# Patient Record
Sex: Female | Born: 1940 | Race: White | Hispanic: No | Smoking: Never smoker
Health system: Southern US, Community
[De-identification: ages and names within clinical notes are randomized; demographics above are authoritative.]

## PROBLEM LIST (undated history)

## (undated) DIAGNOSIS — E119 Type 2 diabetes mellitus without complications: Secondary | ICD-10-CM

## (undated) HISTORY — PX: APPENDECTOMY: SHX54

## (undated) HISTORY — PX: BREAST BIOPSY: SHX20

## (undated) HISTORY — PX: ABDOMINAL HYSTERECTOMY: SHX81

## (undated) HISTORY — PX: CARDIAC CATHETERIZATION: SHX172

## (undated) HISTORY — PX: CHOLECYSTECTOMY: SHX55

---

## 2003-11-04 ENCOUNTER — Other Ambulatory Visit: Payer: Self-pay

## 2003-11-05 ENCOUNTER — Other Ambulatory Visit: Payer: Self-pay

## 2004-06-18 ENCOUNTER — Encounter: Payer: Self-pay | Admitting: Cardiology

## 2004-07-19 ENCOUNTER — Encounter: Payer: Self-pay | Admitting: Cardiology

## 2004-08-18 ENCOUNTER — Encounter: Payer: Self-pay | Admitting: Cardiology

## 2004-08-23 ENCOUNTER — Emergency Department: Payer: Self-pay | Admitting: Emergency Medicine

## 2004-09-18 ENCOUNTER — Encounter: Payer: Self-pay | Admitting: Cardiology

## 2004-10-19 ENCOUNTER — Encounter: Payer: Self-pay | Admitting: Cardiology

## 2004-11-16 ENCOUNTER — Encounter: Payer: Self-pay | Admitting: Cardiology

## 2004-12-17 ENCOUNTER — Encounter: Payer: Self-pay | Admitting: Cardiology

## 2005-01-16 ENCOUNTER — Ambulatory Visit: Payer: Self-pay | Admitting: Urology

## 2005-01-16 ENCOUNTER — Encounter: Payer: Self-pay | Admitting: Cardiology

## 2005-01-25 ENCOUNTER — Other Ambulatory Visit: Payer: Self-pay

## 2005-01-26 ENCOUNTER — Ambulatory Visit: Payer: Self-pay | Admitting: Urology

## 2005-01-29 ENCOUNTER — Emergency Department: Payer: Self-pay | Admitting: Emergency Medicine

## 2005-02-08 ENCOUNTER — Ambulatory Visit: Payer: Self-pay | Admitting: Unknown Physician Specialty

## 2005-02-16 ENCOUNTER — Encounter: Payer: Self-pay | Admitting: Cardiology

## 2005-03-18 ENCOUNTER — Encounter: Payer: Self-pay | Admitting: Cardiology

## 2005-04-26 ENCOUNTER — Ambulatory Visit: Payer: Self-pay | Admitting: Unknown Physician Specialty

## 2006-12-21 ENCOUNTER — Ambulatory Visit: Payer: Self-pay | Admitting: Unknown Physician Specialty

## 2006-12-21 ENCOUNTER — Other Ambulatory Visit: Payer: Self-pay

## 2006-12-28 ENCOUNTER — Ambulatory Visit: Payer: Self-pay | Admitting: Unknown Physician Specialty

## 2007-09-05 ENCOUNTER — Ambulatory Visit: Payer: Self-pay | Admitting: Unknown Physician Specialty

## 2009-04-22 ENCOUNTER — Ambulatory Visit: Payer: Self-pay | Admitting: Unknown Physician Specialty

## 2009-07-02 ENCOUNTER — Emergency Department: Payer: Self-pay | Admitting: Emergency Medicine

## 2010-06-06 ENCOUNTER — Ambulatory Visit: Payer: Self-pay | Admitting: Unknown Physician Specialty

## 2010-08-31 ENCOUNTER — Ambulatory Visit: Payer: Self-pay | Admitting: Unknown Physician Specialty

## 2011-02-21 ENCOUNTER — Emergency Department: Payer: Self-pay | Admitting: Emergency Medicine

## 2011-03-08 ENCOUNTER — Emergency Department: Payer: Self-pay | Admitting: Emergency Medicine

## 2011-04-14 ENCOUNTER — Ambulatory Visit: Payer: Self-pay | Admitting: Unknown Physician Specialty

## 2011-07-05 ENCOUNTER — Ambulatory Visit: Payer: Self-pay | Admitting: Unknown Physician Specialty

## 2012-03-07 ENCOUNTER — Emergency Department: Payer: Self-pay | Admitting: Emergency Medicine

## 2012-03-08 LAB — COMPREHENSIVE METABOLIC PANEL
Albumin: 3.3 g/dL — ABNORMAL LOW (ref 3.4–5.0)
Alkaline Phosphatase: 218 U/L — ABNORMAL HIGH (ref 50–136)
Calcium, Total: 8.9 mg/dL (ref 8.5–10.1)
Chloride: 103 mmol/L (ref 98–107)
EGFR (African American): >60
Potassium: 4.7 mmol/L (ref 3.5–5.1)
SGPT (ALT): 39 U/L
Sodium: 138 mmol/L (ref 136–145)

## 2012-03-08 LAB — URINALYSIS, COMPLETE
Blood: NEGATIVE
Glucose,UR: NEGATIVE mg/dL (ref 0–75)
Ketone: NEGATIVE
Nitrite: NEGATIVE
Ph: 6 (ref 4.5–8.0)
Protein: NEGATIVE
RBC,UR: <1 /HPF (ref 0–5)
Specific Gravity: 1.008 (ref 1.003–1.030)
Squamous Epithelial: 1
WBC UR: <1 /HPF (ref 0–5)

## 2012-03-08 LAB — CBC
MCH: 32.4 pg (ref 26.0–34.0)
MCHC: 33.8 g/dL (ref 32.0–36.0)
Platelet: 162 10*3/uL (ref 150–440)
RDW: 15.2 % — ABNORMAL HIGH (ref 11.5–14.5)

## 2013-05-13 ENCOUNTER — Ambulatory Visit: Payer: Self-pay | Admitting: Physician Assistant

## 2014-03-07 LAB — CBC
HCT: 34.2 % — AB (ref 35.0–47.0)
HGB: 11.3 g/dL — ABNORMAL LOW (ref 12.0–16.0)
MCH: 31.9 pg (ref 26.0–34.0)
MCHC: 32.9 g/dL (ref 32.0–36.0)
MCV: 97 fL (ref 80–100)
Platelet: 181 10*3/uL (ref 150–440)
RBC: 3.52 10*6/uL — AB (ref 3.80–5.20)
RDW: 14.1 % (ref 11.5–14.5)
WBC: 9.9 10*3/uL (ref 3.6–11.0)

## 2014-03-07 LAB — COMPREHENSIVE METABOLIC PANEL
ALK PHOS: 152 U/L — AB
Albumin: 3.4 g/dL (ref 3.4–5.0)
Anion Gap: 6 — ABNORMAL LOW (ref 7–16)
BUN: 7 mg/dL (ref 7–18)
Bilirubin,Total: 0.2 mg/dL (ref 0.2–1.0)
CO2: 26 mmol/L (ref 21–32)
Calcium, Total: 9.1 mg/dL (ref 8.5–10.1)
Chloride: 103 mmol/L (ref 98–107)
Creatinine: 0.7 mg/dL (ref 0.60–1.30)
EGFR (African American): >60
EGFR (Non-African Amer.): >60
Glucose: 129 mg/dL — ABNORMAL HIGH (ref 65–99)
Osmolality: 270 (ref 275–301)
POTASSIUM: 3.8 mmol/L (ref 3.5–5.1)
SGOT(AST): 21 U/L (ref 15–37)
SGPT (ALT): 21 U/L (ref 12–78)
Sodium: 135 mmol/L — ABNORMAL LOW (ref 136–145)
TOTAL PROTEIN: 7.6 g/dL (ref 6.4–8.2)

## 2014-03-07 LAB — TROPONIN I: Troponin-I: <0.02 ng/mL

## 2014-03-08 ENCOUNTER — Observation Stay: Payer: Self-pay | Admitting: Internal Medicine

## 2014-03-08 LAB — TROPONIN I
Troponin-I: <0.02 ng/mL
Troponin-I: <0.02 ng/mL

## 2014-03-08 LAB — CK TOTAL AND CKMB (NOT AT ARMC)
CK, Total: 21 U/L — ABNORMAL LOW
CK, Total: 23 U/L — ABNORMAL LOW

## 2014-03-08 LAB — CK-MB: CK-MB: <0.5 ng/mL — ABNORMAL LOW (ref 0.5–3.6)

## 2015-01-09 NOTE — H&P (Signed)
PATIENT NAME:  Connie Ortega, Connie Ortega MR#:  [id number] DATE OF BIRTH:  [birth year]  DATE OF ADMISSION:  03/08/2014  REFERRING PHYSICIAN:  Dr. Jacqualine Code.   PRIMARY CARE PHYSICIAN:  Dr. Carrie Mew.   CARDIOLOGIST:  Dr. Ubaldo Glassing.   CHIEF COMPLAINT:  Chest pain.   HISTORY OF PRESENT ILLNESS:  A 74 year old Caucasian female with past medical history of coronary artery disease status post PCI and stenting x 2 presenting with chest pain.  She describes acute onset of chest pain which occurred at rest located over the left chest with radiation to the left neck and back.  Describes this as dull in quality, 9 to 10 out of 10 in intensity, actually pleuritic in nature.  Denies any relieving factors or any associated symptoms such as shortness of breath, diaphoresis or nausea.  Currently, she is pain-free and no symptoms at this time.   REVIEW OF SYSTEMS:  CONSTITUTIONAL:  Denies fever, chills, fatigue, weakness.  EYES:  Denies blurry vision, double vision, eye pain.  EARS, NOSE, THROAT:  Denies tinnitus, ear pain, hearing loss.  RESPIRATORY:  Denies cough, wheeze, shortness of breath.  CARDIOVASCULAR:  Positive for chest pain as described above; however, denies any palpitations, edema.  GASTROINTESTINAL:  Denies nausea, vomiting, diarrhea, abdominal pain.  GENITOURINARY:  Denies dysuria, hematuria.  ENDOCRINE:  Denies nocturia or thyroid problems.  HEMATOLOGIC AND LYMPHATIC:  Denies easy bruising, bleeding.  SKIN:  Denies rashes or lesions.  MUSCULOSKELETAL:  Denies pain in neck, back, shoulder, knees, hips or arthritic symptoms. NEUROLOGIC:  Denies paralysis, paresthesias.  PSYCHIATRIC:  Denies anxiety or depressive symptoms.  Otherwise, full review of systems performed by me is negative.   PAST MEDICAL HISTORY:  Coronary artery disease status post PCI and stent placement x 2, diabetes non-insulin-requiring, seizure disorder.   SOCIAL HISTORY:  Denies any alcohol, tobacco or drug usage.   FAMILY HISTORY:   Positive for coronary artery disease.   ALLERGIES:  AUGMENTIN, CIPRO, CODEINE, DEMEROL, DOXYCYCYLINE, ETODOLAC, MACROBID, TALWIN, ULTRAM, VIBRAMYCIN AND LATEX.   HOME MEDICATIONS:  Include aspirin 81 mg by mouth daily, tramadol 50 mg by mouth q. 4 hours as needed for pain, Tylenol 325 mg 2 tabs 3 times daily as needed for pain or fever, Micardis 80 mg by mouth daily, Dilantin 100 mg 3 capsules by mouth at bedtime, Dilantin 50 mg chewable every other night, gabapentin 300 mg by mouth twice daily, fluoxetine 30 mg by mouth daily, trazodone 50 mg by mouth at bedtime, glipizide 5 mg half tablet by mouth twice daily, metformin 1000 mg in the morning, 1500 mg in the evening, simvastatin 40 mg by mouth at bedtime, Plavix 75 mg by mouth daily, Toprol-XL 100 mg by mouth daily, nystatin triamcinolone topical ointment apply to affected areas twice daily, Prilosec 20 mg by mouth twice daily.   PHYSICAL EXAMINATION: VITAL SIGNS:  Temperature 97.6, heart rate 85, respirations 24, blood pressure 205/84 on arrival, currently 135/58, saturating 94% on room air.  Weight 119.7 kg, BMI of 46.8.  GENERAL:  Well-nourished, well-developed, Caucasian female, currently in no acute distress.  HEAD:  Normocephalic, atraumatic.  EYES:  Pupils equal, round, reactive to light.  Extraocular muscles intact.  No scleral icterus.  MOUTH:  Moist mucosal membranes.  Dentition intact.  No abscess noted.  EARS, NOSE, THROAT:  Clear without exudates.  No external lesions.  NECK:  Supple.  No thyromegaly.  No nodules.  No JVD.  PULMONARY:  Clear to auscultation bilaterally without wheezes, rubs or rhonchi.  No accessory  muscle use.  Good respiratory effort.  CHEST:  Nontender to palpation.  CARDIOVASCULAR:  S1, S2, regular rate and rhythm.  No murmurs, rubs, or gallops.  No edema.  Pedal pulses 2+ bilaterally. GASTROINTESTINAL:  Soft, nontender, nondistended.  No masses.  Positive bowel sounds.  No hepatosplenomegaly.  MUSCULOSKELETAL:   No swelling, clubbing, or edema.  Range of motion full in all extremities.  NEUROLOGIC:  Cranial nerves II through XII intact.  No gross focal neurological deficits.  Sensation intact.  Reflexes intact.  SKIN:  No ulceration, lesions, rash, cyanosis.  Skin warm, dry.  Turgor intact.  PSYCHIATRIC:  Mood and affect within normal limits.  The patient is awake, alert, oriented x 3.  Insight and judgment intact.   LABORATORY DATA:  EKG performed.  Normal sinus rhythm without ST or T wave abnormalities.  Chest x-ray performed revealing no acute cardiopulmonary process.  CT chest to rule out PE performed, no evidence of PE.  There is evidence of diffuse thickening of the esophagus at the GE junction.  Remainder of laboratory data:  Sodium 135, potassium 3.8, chloride 103, bicarb 26, BUN 7, creatinine 1.7, glucose 129.  LFTs:  Alk phos of 152, otherwise within normal limits.  Troponin I less than 0.02.  WBC 9.9, hemoglobin 11.3, platelets of 181.   ASSESSMENT AND PLAN:  A 74 year old Caucasian female with history of coronary artery disease status post PCI and stent placement x 2 presenting with chest pain.  1.  Chest pain, initiate aspirin and statin therapy.  Place on telemetry under observational status.  Trend cardiac enzymes x 3.  CT was negative for acute pulmonary embolus.  We will also consult her cardiologist as she follows with Dr. Ubaldo Glassing on a routine basis.  2.  Diabetes type 2.  Hold by mouth agents.  Add insulin sliding scale with q. 6 hour Accu-Cheks while she is in the hospital.  3.  Seizure disorder.  Continue with her Dilantin.  4.  Elevated alkaline phosphatase.  Asymptomatic at this time.  It also appears to be chronic.  She should receive an outpatient work-up of this.  5.  Thickening of the esophagus at the gastroesophageal junction.  Continue with proton pump inhibitor therapy.  Will need outpatient follow-up for this as well.  6.  Venous thromboembolism prophylaxis with heparin subQ.  7.   CODE STATUS:  THE PATIENT IS FULL CODE.   TIME SPENT:  45 minutes.     ____________________________ Aaron Mose. Michiel Sivley, MD dkh:ea D: 03/08/2014 01:44:07 ET T: 03/08/2014 02:19:53 ET JOB#: [id number]  cc: Aaron Mose. Aava Deland, MD, <Dictator> Yaslene Lindamood Woodfin Ganja MD ELECTRONICALLY SIGNED 03/09/2014 1:37

## 2015-01-09 NOTE — Consult Note (Signed)
Brief Consult Note: Diagnosis: chest pain with atypcial features.   Patient was seen by consultant.   Comments: [id number] yowit history of cad s/p pci of lad who was admitted with atypcial cp. Ruled out for mi. No ischemia on ekg. OK for discharge from cardiac standpoint on current meds. Call office in am to schedule outpatinet stress test.  Electronic Signatures: Dalia HeadingFath, Kenneth A (MD)  (Signed 21-Jun-15 13:21)  Authored: Brief Consult Note   Last Updated: 21-Jun-15 13:21 by Dalia HeadingFath, Kenneth A (MD)

## 2015-01-09 NOTE — Discharge Summary (Signed)
PATIENT NAME:  Connie Ortega, [name] MR#:  [id number] DATE OF BIRTH:  1940/12/07  DATE OF ADMISSION:  03/08/2014 DATE OF DISCHARGE:  03/08/2014  PRIMARY CARE PHYSICIAN: Maurine MinisterMiriam McLaughlin, PA-C   DISCHARGE DIAGNOSES: 1.  Atypical chest pain.  2.  Coronary artery disease.  3.  Diabetes.  4.  Hypertension.  5.  Seizure disorder.   CONDITION: Stable.   CODE STATUS: Full code.   HOME MEDICATIONS: Please refer to the medication reconciliation list.   DIET: Low-sodium, low-fat, low-cholesterol, ADA diet.   ACTIVITY: As tolerated.   FOLLOWUP CARE: Follow up with PCP within 1 to 2 weeks. Follow up with Dr. Lady GaryFath within 1 to 2 weeks.   REASON FOR ADMISSION: Chest pain.   HOSPITAL COURSE: The patient is 74 year old Caucasian female with a history of coronary artery disease, diabetes, hypertension presented to the ED with chest pain which is on the left side, radiation to left leg and back. For detailed history and physical examination, please refer to the admission note dictated by Dr. Clint GuyHower. Laboratory data on admission date showed troponin was less than 0.02. EKG normal sinus rhythm. Chest x-ray no acute cardiopulmonary process. CT of the chest to rule out PE. WBC 9.9, hemoglobin 11.3.  1.  Chest pain which is atypical. The patient had complaints of lower part of the substernal and epigastric area pain which is exacerbated by breathing and palpation. The patient's troponin level has been negative. She has been treated with aspirin. Dr. Lady GaryFath evaluated the patient and suggested that the patient may be discharged to home today and follow up with him as outpatient.  2.  Diabetes. Has been treated with sliding scale.  3.  The patient said she has a history of peptic ulcer disease.  The patient may need to follow up with PCP or gastrointestinal physician as outpatient after discharge.   Discussed the patient's discharge plan with the patient and nurse.   TIME SPENT: About 35 minutes.    ____________________________ Shaune PollackQing Chen, MD qc:cs D: 03/08/2014 13:58:14 ET T: 03/08/2014 18:25:04 ET JOB#: [id number]  cc: Shaune PollackQing Chen, MD, <Dictator> Shaune PollackQING CHEN MD ELECTRONICALLY SIGNED 03/09/2014 18:05

## 2015-05-13 ENCOUNTER — Other Ambulatory Visit: Payer: Self-pay | Admitting: Physician Assistant

## 2015-05-13 DIAGNOSIS — Z1231 Encounter for screening mammogram for malignant neoplasm of breast: Secondary | ICD-10-CM

## 2015-05-19 ENCOUNTER — Other Ambulatory Visit: Payer: Self-pay | Admitting: Physician Assistant

## 2015-05-19 ENCOUNTER — Ambulatory Visit
Admission: RE | Admit: 2015-05-19 | Discharge: 2015-05-19 | Disposition: A | Discharge status: Home / Self Care | Payer: Medicare Other | Source: Ambulatory Visit | Attending: Physician Assistant | Admitting: Physician Assistant

## 2015-05-19 DIAGNOSIS — Z1231 Encounter for screening mammogram for malignant neoplasm of breast: Secondary | ICD-10-CM | POA: Insufficient documentation

## 2015-06-18 ENCOUNTER — Encounter: Payer: Self-pay | Admitting: Emergency Medicine

## 2015-06-18 ENCOUNTER — Emergency Department: Payer: Medicare Other

## 2015-06-18 ENCOUNTER — Emergency Department
Admission: EM | Admit: 2015-06-18 | Discharge: 2015-06-18 | Disposition: A | Discharge status: Home / Self Care | Payer: Medicare Other | Attending: Emergency Medicine | Admitting: Emergency Medicine

## 2015-06-18 DIAGNOSIS — S6992XA Unspecified injury of left wrist, hand and finger(s), initial encounter: Secondary | ICD-10-CM | POA: Diagnosis present

## 2015-06-18 DIAGNOSIS — Z7902 Long term (current) use of antithrombotics/antiplatelets: Secondary | ICD-10-CM | POA: Insufficient documentation

## 2015-06-18 DIAGNOSIS — S8002XA Contusion of left knee, initial encounter: Secondary | ICD-10-CM | POA: Insufficient documentation

## 2015-06-18 DIAGNOSIS — Z7982 Long term (current) use of aspirin: Secondary | ICD-10-CM | POA: Diagnosis not present

## 2015-06-18 DIAGNOSIS — Y9389 Activity, other specified: Secondary | ICD-10-CM | POA: Insufficient documentation

## 2015-06-18 DIAGNOSIS — Y92481 Parking lot as the place of occurrence of the external cause: Secondary | ICD-10-CM | POA: Diagnosis not present

## 2015-06-18 DIAGNOSIS — W01198A Fall on same level from slipping, tripping and stumbling with subsequent striking against other object, initial encounter: Secondary | ICD-10-CM | POA: Diagnosis not present

## 2015-06-18 DIAGNOSIS — Z79899 Other long term (current) drug therapy: Secondary | ICD-10-CM | POA: Diagnosis not present

## 2015-06-18 DIAGNOSIS — Y998 Other external cause status: Secondary | ICD-10-CM | POA: Insufficient documentation

## 2015-06-18 DIAGNOSIS — S61412A Laceration without foreign body of left hand, initial encounter: Secondary | ICD-10-CM | POA: Diagnosis not present

## 2015-06-18 DIAGNOSIS — S52515A Nondisplaced fracture of left radial styloid process, initial encounter for closed fracture: Secondary | ICD-10-CM | POA: Insufficient documentation

## 2015-06-18 DIAGNOSIS — E119 Type 2 diabetes mellitus without complications: Secondary | ICD-10-CM | POA: Diagnosis not present

## 2015-06-18 DIAGNOSIS — S60212A Contusion of left wrist, initial encounter: Secondary | ICD-10-CM | POA: Diagnosis not present

## 2015-06-18 DIAGNOSIS — S0512XA Contusion of eyeball and orbital tissues, left eye, initial encounter: Secondary | ICD-10-CM | POA: Insufficient documentation

## 2015-06-18 DIAGNOSIS — S0083XA Contusion of other part of head, initial encounter: Secondary | ICD-10-CM

## 2015-06-18 DIAGNOSIS — S0990XA Unspecified injury of head, initial encounter: Secondary | ICD-10-CM | POA: Insufficient documentation

## 2015-06-18 DIAGNOSIS — W19XXXA Unspecified fall, initial encounter: Secondary | ICD-10-CM

## 2015-06-18 DIAGNOSIS — S52502A Unspecified fracture of the lower end of left radius, initial encounter for closed fracture: Secondary | ICD-10-CM

## 2015-06-18 HISTORY — DX: Type 2 diabetes mellitus without complications: E11.9

## 2015-06-18 MED ORDER — LIDOCAINE HCL (PF) 1 % IJ SOLN
INTRAMUSCULAR | Status: AC
  Filled 2015-06-18: qty 5

## 2015-06-18 NOTE — ED Notes (Addendum)
Pt arrived via EMS from apartment complex.  Patient states she was just paying her rent and tripped over the curb outside her building and fell on left side of face.  Large hematoma over left eye on arrival and skin tear to left head and elbow.  Pt denies LOC.  No vision problems.  Uses cane usually at home.  Had a fall previously in her apartment earlier this year.

## 2015-06-18 NOTE — Discharge Instructions (Signed)
Facial or Scalp Contusion A facial or scalp contusion is a deep bruise on the face or head. Injuries to the face and head generally cause a lot of swelling, especially around the eyes. Contusions are the result of an injury that caused bleeding under the skin. The contusion may turn blue, purple, or yellow. Minor injuries will give you a painless contusion, but more severe contusions may stay painful and swollen for a few weeks.  CAUSES  A facial or scalp contusion is caused by a blunt injury or trauma to the face or head area.  SIGNS AND SYMPTOMS   Swelling of the injured area.   Discoloration of the injured area.   Tenderness, soreness, or pain in the injured area.  DIAGNOSIS  The diagnosis can be made by taking a medical history and doing a physical exam. An X-ray exam, CT scan, or MRI may be needed to determine if there are any associated injuries, such as broken bones (fractures). TREATMENT  Often, the best treatment for a facial or scalp contusion is applying cold compresses to the injured area. Over-the-counter medicines may also be recommended for pain control.  HOME CARE INSTRUCTIONS   Only take over-the-counter or prescription medicines as directed by your health care provider.   Apply ice to the injured area.   Put ice in a plastic bag.   Place a towel between your skin and the bag.   Leave the ice on for 20 minutes, 2-3 times a day.  SEEK MEDICAL CARE IF:  You have bite problems.   You have pain with chewing.   You are concerned about facial defects. SEEK IMMEDIATE MEDICAL CARE IF:  You have severe pain or a headache that is not relieved by medicine.   You have unusual sleepiness, confusion, or personality changes.   You throw up (vomit).   You have a persistent nosebleed.   You have double vision or blurred vision.   You have fluid drainage from your nose or ear.   You have difficulty walking or using your arms or legs.  MAKE SURE YOU:    Understand these instructions.  Will watch your condition.  Will get help right away if you are not doing well or get worse. Document Released: 10/12/2004 Document Revised: 06/25/2013 Document Reviewed: 04/17/2013 [facility] Patient Information 2015 [place], [place]. This information is not intended to replace advice given to you by your health care provider. Make sure you discuss any questions you have with your health care provider.  Forearm Fracture Your caregiver has diagnosed you as having a broken bone (fracture) of the forearm. This is the part of your arm between the elbow and your wrist. Your forearm is made up of two bones. These are the radius and ulna. A fracture is a break in one or both bones. A cast or splint is used to protect and keep your injured bone from moving. The cast or splint will be on generally for about 5 to 6 weeks, with individual variations. HOME CARE INSTRUCTIONS   Keep the injured part elevated while sitting or lying down. Keeping the injury above the level of your heart (the center of the chest). This will decrease swelling and pain.  Apply ice to the injury for 15-20 minutes, 03-04 times per day while awake, for 2 days. Put the ice in a plastic bag and place a thin towel between the bag of ice and your cast or splint.  If you have a plaster or fiberglass cast:  Do  not try to scratch the skin under the cast using sharp or pointed objects.  Check the skin around the cast every day. You may put lotion on any red or sore areas.  Keep your cast dry and clean.  If you have a plaster splint:  Wear the splint as directed.  You may loosen the elastic around the splint if your fingers become numb, tingle, or turn cold or blue.  Do not put pressure on any part of your cast or splint. It may break. Rest your cast only on a pillow the first 24 hours until it is fully hardened.  Your cast or splint can be protected during bathing with a plastic bag. Do not lower  the cast or splint into water.  Only take over-the-counter or prescription medicines for pain, discomfort, or fever as directed by your caregiver. SEEK IMMEDIATE MEDICAL CARE IF:   Your cast gets damaged or breaks.  You have more severe pain or swelling than you did before the cast.  Your skin or nails below the injury turn blue or gray, or feel cold or numb.  There is a bad smell or new stains and/or pus like (purulent) drainage coming from under the cast. MAKE SURE YOU:   Understand these instructions.  Will watch your condition.  Will get help right away if you are not doing well or get worse. Document Released: 09/01/2000 Document Revised: 11/27/2011 Document Reviewed: 04/23/2008 [facility] Patient Information 2015 [place], [place]. This information is not intended to replace advice given to you by your health care provider. Make sure you discuss any questions you have with your health care provider.  Head Injury You have received a head injury. It does not appear serious at this time. Headaches and vomiting are common following head injury. It should be easy to awaken from sleeping. Sometimes it is necessary for you to stay in the emergency department for a while for observation. Sometimes admission to the hospital may be needed. After injuries such as yours, most problems occur within the first 24 hours, but side effects may occur up to 7-10 days after the injury. It is important for you to carefully monitor your condition and contact your health care provider or seek immediate medical care if there is a change in your condition. WHAT ARE THE TYPES OF HEAD INJURIES? Head injuries can be as minor as a bump. Some head injuries can be more severe. More severe head injuries include:  A jarring injury to the brain (concussion).  A bruise of the brain (contusion). This mean there is bleeding in the brain that can cause swelling.  A cracked skull (skull fracture).  Bleeding in the brain  that collects, clots, and forms a bump (hematoma). WHAT CAUSES A HEAD INJURY? A serious head injury is most likely to happen to someone who is in a car wreck and is not wearing a seat belt. Other causes of major head injuries include bicycle or motorcycle accidents, sports injuries, and falls. HOW ARE HEAD INJURIES DIAGNOSED? A complete history of the event leading to the injury and your current symptoms will be helpful in diagnosing head injuries. Many times, pictures of the brain, such as CT or MRI are needed to see the extent of the injury. Often, an overnight hospital stay is necessary for observation.  WHEN SHOULD I SEEK IMMEDIATE MEDICAL CARE?  You should get help right away if:  You have confusion or drowsiness.  You feel sick to your stomach (nauseous) or have continued,  forceful vomiting.  You have dizziness or unsteadiness that is getting worse.  You have severe, continued headaches not relieved by medicine. Only take over-the-counter or prescription medicines for pain, fever, or discomfort as directed by your health care provider.  You do not have normal function of the arms or legs or are unable to walk.  You notice changes in the black spots in the center of the colored part of your eye (pupil).  You have a clear or bloody fluid coming from your nose or ears.  You have a loss of vision. During the next 24 hours after the injury, you must stay with someone who can watch you for the warning signs. This person should contact local emergency services (911 in the [place] if you have seizures, you become unconscious, or you are unable to wake up. HOW CAN I PREVENT A HEAD INJURY IN THE FUTURE? The most important factor for preventing major head injuries is avoiding motor vehicle accidents. To minimize the potential for damage to your head, it is crucial to wear seat belts while riding in motor vehicles. Wearing helmets while bike riding and playing collision sports (like football) is  also helpful. Also, avoiding dangerous activities around the house will further help reduce your risk of head injury.  WHEN CAN I RETURN TO NORMAL ACTIVITIES AND ATHLETICS? You should be reevaluated by your health care provider before returning to these activities. If you have any of the following symptoms, you should not return to activities or contact sports until 1 week after the symptoms have stopped:  Persistent headache.  Dizziness or vertigo.  Poor attention and concentration.  Confusion.  Memory problems.  Nausea or vomiting.  Fatigue or tire easily.  Irritability.  Intolerant of bright lights or loud noises.  Anxiety or depression.  Disturbed sleep. MAKE SURE YOU:   Understand these instructions.  Will watch your condition.  Will get help right away if you are not doing well or get worse. Document Released: 09/04/2005 Document Revised: 09/09/2013 Document Reviewed: 05/12/2013 [facility] Patient Information 2015 [place], [place]. This information is not intended to replace advice given to you by your health care provider. Make sure you discuss any questions you have with your health care provider.  Laceration Care, Adult A laceration is a cut that goes through all layers of the skin. The cut goes into the tissue beneath the skin. HOME CARE For stitches (sutures) or staples:  Keep the cut clean and dry.  If you have a bandage (dressing), change it at least once a day. Change the bandage if it gets wet or dirty, or as told by your doctor.  Wash the cut with soap and water 2 times a day. Rinse the cut with water. Pat it dry with a clean towel.  Put a thin layer of medicated cream on the cut as told by your doctor.  You may shower after the first 24 hours. Do not soak the cut in water until the stitches are removed.  Only take medicines as told by your doctor.  Have your stitches or staples removed as told by your doctor. For skin adhesive strips:  Keep the cut  clean and dry.  Do not get the strips wet. You may take a bath, but be careful to keep the cut dry.  If the cut gets wet, pat it dry with a clean towel.  The strips will fall off on their own. Do not remove the strips that are still stuck to the cut.  For wound glue:  You may shower or take baths. Do not soak or scrub the cut. Do not swim. Avoid heavy sweating until the glue falls off on its own. After a shower or bath, pat the cut dry with a clean towel.  Do not put medicine on your cut until the glue falls off.  If you have a bandage, do not put tape over the glue.  Avoid lots of sunlight or tanning lamps until the glue falls off. Put sunscreen on the cut for the first year to reduce your scar.  The glue will fall off on its own. Do not pick at the glue. You may need a tetanus shot if:  You cannot remember when you had your last tetanus shot.  You have never had a tetanus shot. If you need a tetanus shot and you choose not to have one, you may get tetanus. Sickness from tetanus can be serious. GET HELP RIGHT AWAY IF:   Your pain does not get better with medicine.  Your arm, hand, leg, or foot loses feeling (numbness) or changes color.  Your cut is bleeding.  Your joint feels weak, or you cannot use your joint.  You have painful lumps on your body.  Your cut is red, puffy (swollen), or painful.  You have a red line on the skin near the cut.  You have yellowish-white fluid (pus) coming from the cut.  You have a fever.  You have a bad smell coming from the cut or bandage.  Your cut breaks open before or after stitches are removed.  You notice something coming out of the cut, such as wood or glass.  You cannot move a finger or toe. MAKE SURE YOU:   Understand these instructions.  Will watch your condition.  Will get help right away if you are not doing well or get worse. Document Released: 02/21/2008 Document Revised: 11/27/2011 Document Reviewed:  02/28/2011 [facility] Patient Information 2015 [place], [place]. This information is not intended to replace advice given to you by your health care provider. Make sure you discuss any questions you have with your health care provider.

## 2015-06-18 NOTE — ED Provider Notes (Signed)
[facility] Emergency Department Provider Note  ____________________________________________  Time seen: Approximately 4:31 PM  I have reviewed the triage vital signs and the nursing notes.   HISTORY  Chief Complaint Fall    HPI Connie Ortega is a 74 y.o. female presents via EMS for evaluation of falling prior to arrival. Patient states that she was outside and tripped over the curb in the parking lot landing directly on her face. Complains of swelling and bruising over her left eye, the skin tear to her left hand and elbow, bruising and swelling to her left knee.   Past Medical History  Diagnosis Date  . Diabetes mellitus without complication     There are no active problems to display for this patient.   Past Surgical History  Procedure Laterality Date  . Breast biopsy Right 20+ yrs ago    EXCISIONAL - NEG  . Breast biopsy Right 20+ yrs ago    EXCISIONAL - NEG  . Cardiac catheterization      2 stents in place  . Cholecystectomy    . Appendectomy    . Abdominal hysterectomy      Current Outpatient Rx  Name  Route  Sig  Dispense  Refill  . alendronate (FOSAMAX) 70 MG tablet   Oral   Take 70 mg by mouth once a week. Take with a full glass of water on an empty stomach.         Marland Kitchen aspirin 81 MG tablet   Oral   Take 81 mg by mouth daily.         . clopidogrel (PLAVIX) 75 MG tablet   Oral   Take 75 mg by mouth daily.         Marland Kitchen FLUoxetine (PROZAC) 10 MG capsule   Oral   Take 10 mg by mouth 3 (three) times daily.         . fluticasone (FLONASE) 50 MCG/ACT nasal spray   Each Nare   Place 2 sprays into both nostrils daily.         Marland Kitchen gabapentin (NEURONTIN) 300 MG capsule   Oral   Take 300 mg by mouth 3 (three) times daily.         Marland Kitchen glipiZIDE (GLUCOTROL) 5 MG tablet   Oral   Take 7.5 mg by mouth daily before breakfast.         . hydrochlorothiazide (HYDRODIURIL) 25 MG tablet   Oral   Take 25 mg by mouth daily.          . metFORMIN (GLUCOPHAGE) 1000 MG tablet   Oral   Take 1,000 mg by mouth 2 (two) times daily with a meal. Take one tablet in the morning and 1.5 tablet at supper         . pantoprazole (PROTONIX) 40 MG tablet   Oral   Take 40 mg by mouth daily.         . phenytoin (DILANTIN) 100 MG ER capsule   Oral   Take 100 mg by mouth 3 (three) times daily.         . phenytoin (DILANTIN) 50 MG tablet   Oral   Chew 50 mg by mouth every other day.         . simvastatin (ZOCOR) 40 MG tablet   Oral   Take 40 mg by mouth daily.         . traMADol (ULTRAM) 50 MG tablet   Oral   Take 50 mg by  mouth every 6 (six) hours as needed.         . traZODone (DESYREL) 50 MG tablet   Oral   Take 50 mg by mouth at bedtime.           Allergies Review of patient's allergies indicates no known allergies.  Family History  Problem Relation Age of Onset  . Breast cancer Neg Hx     Social History Social History  Substance Use Topics  . Smoking status: Never Smoker   . Smokeless tobacco: None  . Alcohol Use: No    Review of Systems Constitutional: No fever/chills Eyes: No visual changes. Positive hematoma inferior superior to the left eye. Positive periorbital edema and hematoma noted. ENT: No sore throat. Cardiovascular: Denies chest pain. Respiratory: Denies shortness of breath. Gastrointestinal: No abdominal pain.  No nausea, no vomiting.  No diarrhea.  No constipation. Genitourinary: Negative for dysuria. Musculoskeletal: Negative for back pain. The first with limited range of motion increased pain with pronation and supination positive ecchymosis noted in around the wrist and base of the thumb. 3 cm laceration noted to the palmar aspect of the left hand. Left knee with ecchymosis and bruising as well. Skin: Negative for rash. Neurological: Negative for headaches, focal weakness or numbness.  10-point ROS otherwise  negative.  ____________________________________________   PHYSICAL EXAM:  VITAL SIGNS: ED Triage Vitals  Enc Vitals Group     BP 06/18/15 1623 175/64 mmHg     Pulse Rate 06/18/15 1623 87     Resp 06/18/15 1623 18     Temp 06/18/15 1623 97.8 F (36.6 C)     Temp Source 06/18/15 1623 Oral     SpO2 06/18/15 1623 94 %     Weight 06/18/15 1623 265 lb (120.203 kg)     Height 06/18/15 1623 5' 4.25" (1.632 m)     Head Cir --      Peak Flow --      Pain Score 06/18/15 1624 7     Pain Loc --      Pain Edu? --      Excl. in GC? --     Constitutional: Alert and oriented. Well appearing and in no acute distress. Eyes: Conjunctivae are normal. PERRL. EOMI.  Positive hematoma inferior superior to the left eye. Positive periorbital Head: Traumatic Nose: No congestion/rhinnorhea. Mouth/Throat: Mucous membranes are moist.  Oropharynx non-erythematous. Neck: No stridor.  No spinal tenderness. Cardiovascular: Normal rate, regular rhythm. Grossly normal heart sounds.  Good peripheral circulation. Respiratory: Normal respiratory effort.  No retractions. Lungs CTAB. Musculoskeletal: No lower extremity tenderness nor edema.  No joint effusions. Negative for back pain. The first with limited range of motion increased pain with pronation and supination positive ecchymosis noted in around the wrist and base of the thumb. 3 cm laceration noted to the palmar aspect of the left hand. Left knee with ecchymosis and bruising as well. Neurologic:  Normal speech and language. No gross focal neurologic deficits are appreciated. No gait instability. Skin:  Skin is warm, dry areas of ecchymosis noted. Left hand is a 3 cm laceration to the palmar aspect. Psychiatric: Mood and affect are normal. Speech and behavior are normal.  ____________________________________________   LABS (all labs ordered are listed, but only abnormal results are displayed)  Labs Reviewed - No data to  display   RADIOLOGY  IMPRESSION: Nondisplaced intraarticular transverse fracture of the distal Radius. IMPRESSION: Nondisplaced fracture of the left radial styloid process. ____________________________________________   PROCEDURES  Procedure(s)  performed: None  Critical Care performed: Yes LACERATION REPAIR Performed by: Evangeline Dakin Authorized by: Evangeline Dakin Consent: Verbal consent obtained. Risks and benefits: risks, benefits and alternatives were discussed Consent given by: patient Patient identity confirmed: provided demographic data Prepped and Draped in normal sterile fashion Wound explored  Laceration Location: dorsum left hand  Laceration Length: 1.5 cm  No Foreign Bodies seen or palpated  Anesthesia: local infiltration  Local anesthetic: lidocaine 1% without epinephrine  Anesthetic total: 3 ml  Irrigation method: syringe Amount of cleaning: standard  Skin closure: 4-0 nylon  Number of sutures: 3  Technique: simple interrupted  Patient tolerance: Patient tolerated the procedure well with no immediate complications.   ____________________________________________   INITIAL IMPRESSION / ASSESSMENT AND PLAN / ED COURSE  Pertinent labs & imaging results that were available during my care of the patient were reviewed by me and considered in my medical decision making (see chart for details).  Status post fall with facial and head contusion and fracture distal radius laceration to dorsum of left hand superficial knee contusion and left ankle contusion. Laceration closed as noted above. Her splint applied. Patient to follow-up with Dr. Hyacinth Meeker, orthopedics on call for wrist evaluation and cast application. Sutures to be removed in one week. Patient currently has tramadol for pain at home she is to continue to take as needed. Follow up in the ER if needed. Head injury instructions given for family. Patient voices no other emergency medical  complaints at this time.  ____________________________________________   FINAL CLINICAL IMPRESSION(S) / ED DIAGNOSES  Final diagnoses:  Fall, initial encounter  Facial contusion, initial encounter  Distal radial fracture, left, closed, initial encounter  Knee contusion, left, initial encounter  Laceration of left hand, initial encounter      Evangeline Dakin, PA-C 06/18/15 1833  Myrna Blazer, MD 06/18/15 2154

## 2016-01-15 IMAGING — CT CT MAXILLOFACIAL W/O CM
2 series · 14 of 30 positions shown, 16 images · non-contrast
Comparison: Head CT July 02, 2009

CLINICAL DATA: Pain following fall

EXAM:
CT HEAD WITHOUT CONTRAST
CT MAXILLOFACIAL WITHOUT CONTRAST
TECHNIQUE: Multidetector CT imaging of the head and maxillofacial structures
were performed using the standard protocol without intravenous
contrast. Multiplanar CT image reconstructions of the maxillofacial
structures were also generated.

[Series 2: head wo · axial · 0.39mm/px · z∈[+258,+366]mm · 6 of 34 slices shown, 8 images]
[im 5/34  brain]
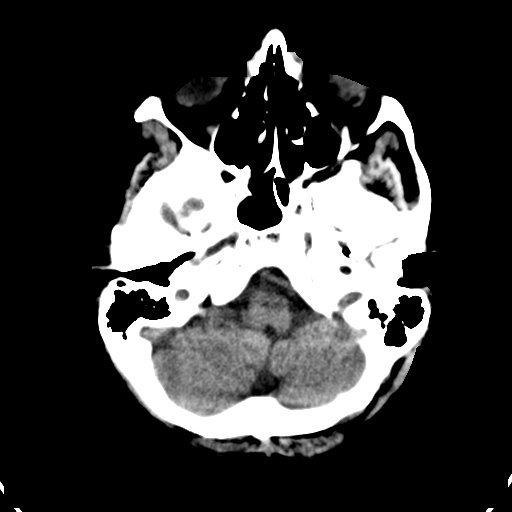
[im 5/34  bone]
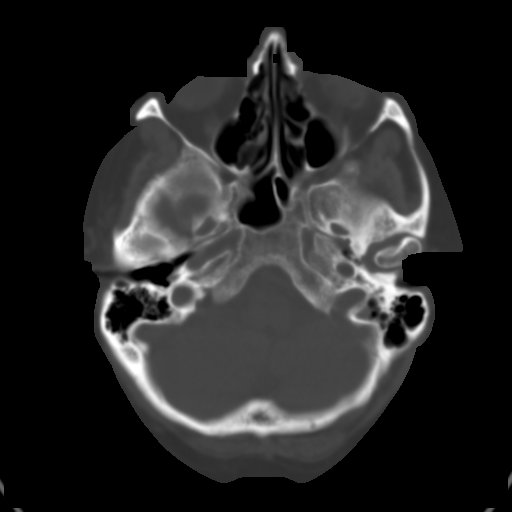
[im 10/34  bone]
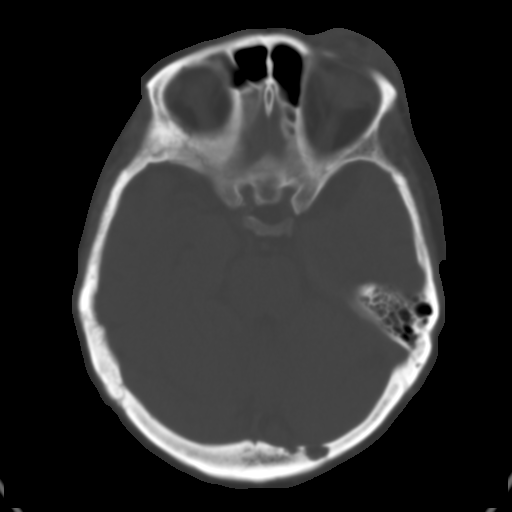
[im 15/34  bone]
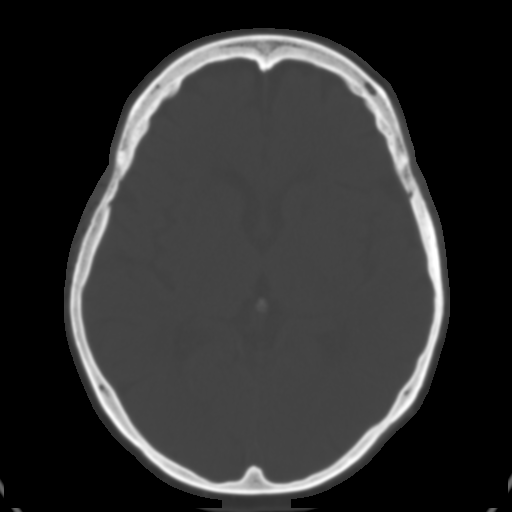
[im 19/34  bone]
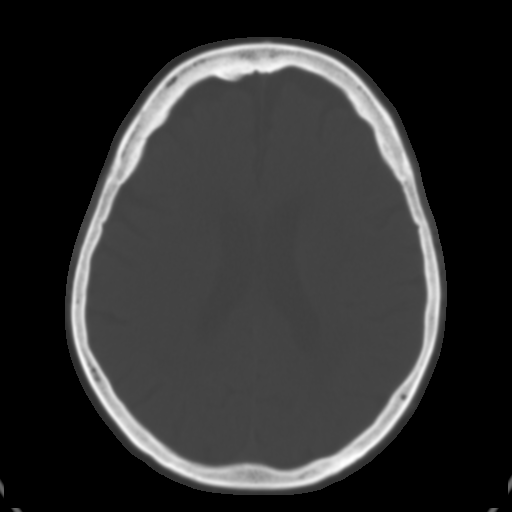
[im 24/34  brain]
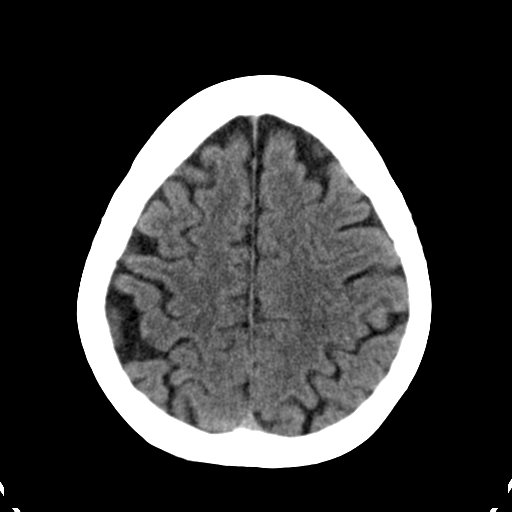
[im 24/34  bone]
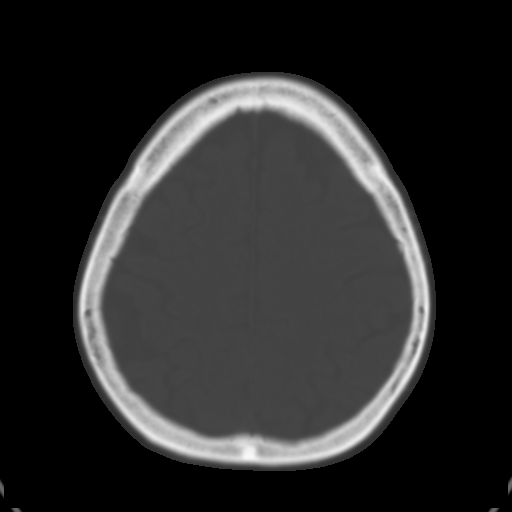
[im 29/34  bone]
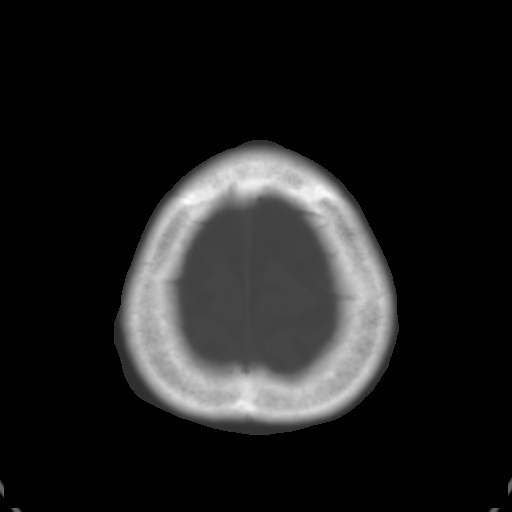

[Series 3: max soft · axial · 0.35mm/px · z∈[+153,+289]mm · 8 of 86 slices shown]
[im 9/86  brain]
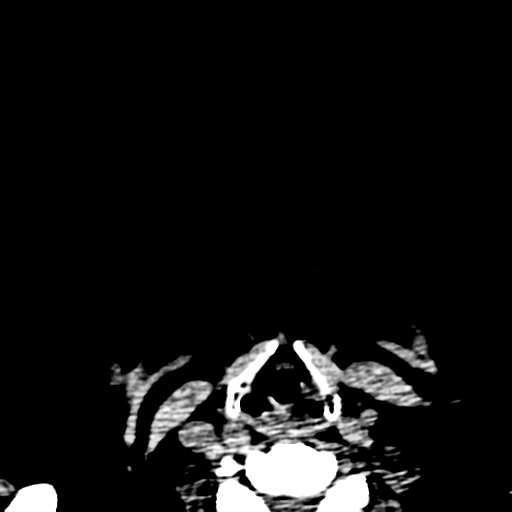
[im 17/86  brain]
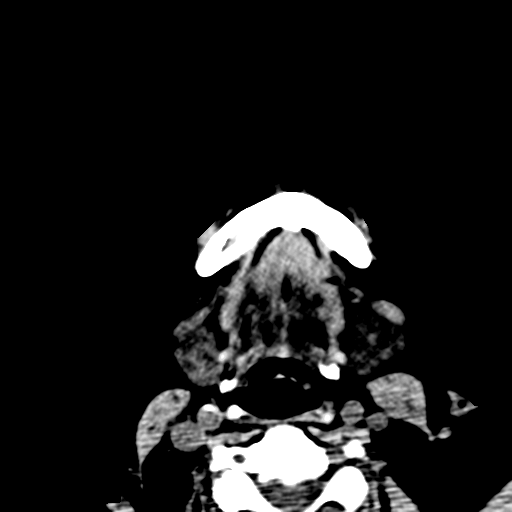
[im 29/86  brain]
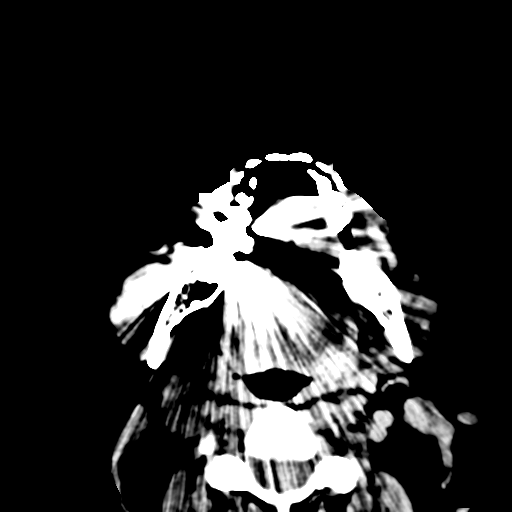
[im 37/86  brain]
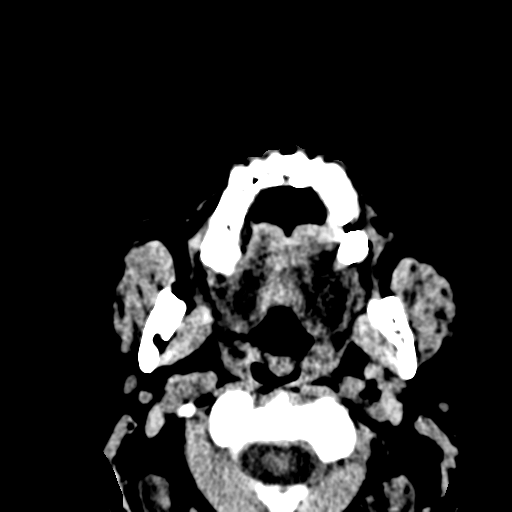
[im 49/86  brain]
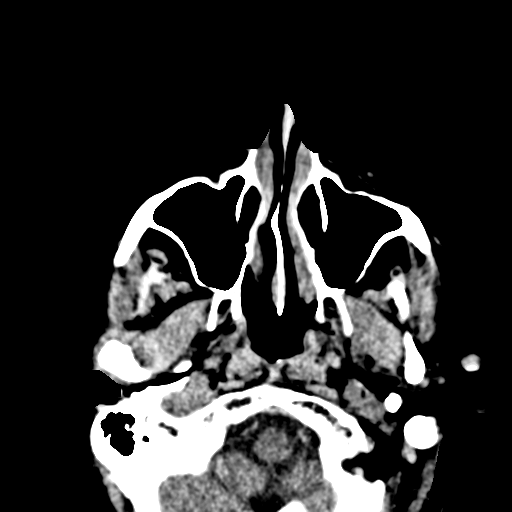
[im 57/86  brain]
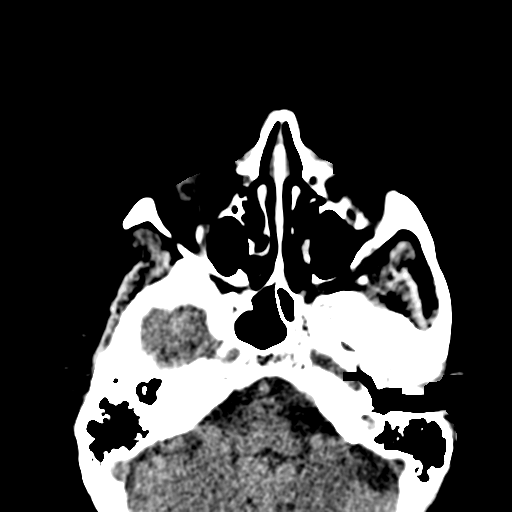
[im 69/86  brain]
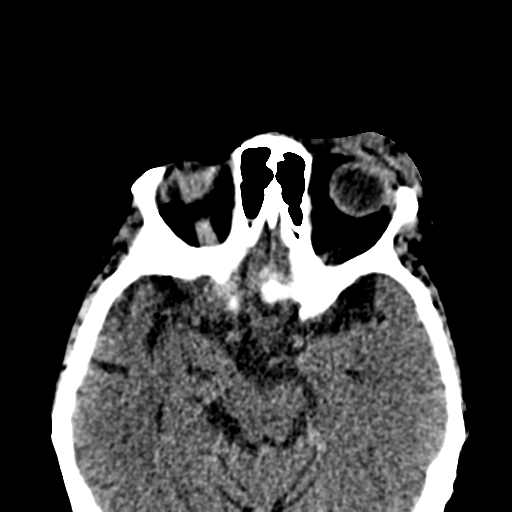
[im 77/86  brain]
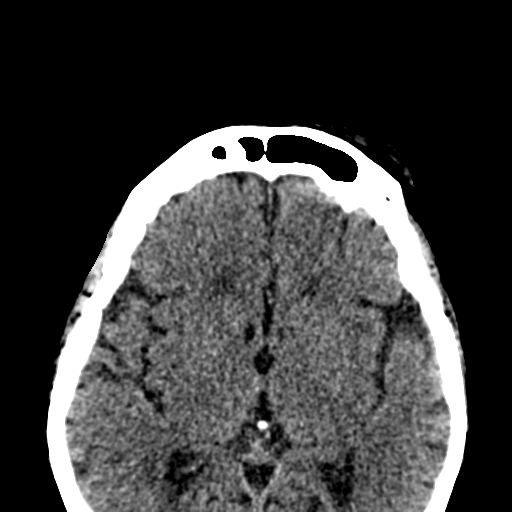

[14 of 30 positions shown; findings below may reference images not displayed]

FINDINGS: CT HEAD FINDINGS

There is mild diffuse atrophy. There is no intracranial mass,
hemorrhage, extra-axial fluid collection, or midline shift. There is
patchy small vessel disease in the centra semiovale bilaterally.
There is evidence of prior lacunar infarcts in both internal and
external capsules anteriorly. There is small vessel disease in the
left pons, stable. No acute infarct is evident on this study. Bony
calvarium appears intact. The mastoid air cells are clear. There is
marked soft tissue swelling over the periorbital and supraorbital
regions on the left.

CT MAXILLOFACIAL FINDINGS

There is marked soft tissue swelling over the left orbit in a
preseptal distribution. This soft tissue swelling extends superior
to the orbit over the left frontal bone as well. There is milder
soft tissue swelling over the left maxillary antrum anteriorly and
laterally. There is no demonstrable fracture or dislocation. There
is no intraorbital lesion on either side. The intraorbital contents
appear symmetric and normal bilaterally.

Paranasal sinuses are clear. Ostiomeatal unit complexes are patent
bilaterally. No air-fluid levels. No bony destruction or expansion.
There is minimal rightward deviation of the nasal septum.

There is osteoarthritic change in each temporomandibular joint.

Salivary glands appear normal.  No adenopathy.
IMPRESSION: CT head: Atrophy with small vessel disease as described. No acute
infarct evident. No intracranial mass, hemorrhage, or acute
appearing infarct. Extensive soft tissue swelling over the left
periorbital and supraorbital regions.

CT maxillofacial: Extensive soft tissue swelling in the preseptal
left orbital region as well as over the left face and supraorbital
region. No fractures. No intraorbital lesions. Paranasal sinuses
clear. Ostiomeatal unit complexes patent bilaterally. Minimal
rightward deviation of nasal septum.

## 2016-01-15 IMAGING — CR DG WRIST COMPLETE 3+V*L*
3 series · 3 of 3 positions shown · non-contrast
Comparison: None.

CLINICAL DATA: Acute left wrist pain following fall today. Initial
encounter.

EXAM:
LEFT WRIST - COMPLETE 3+ VIEW

[wrist pa]
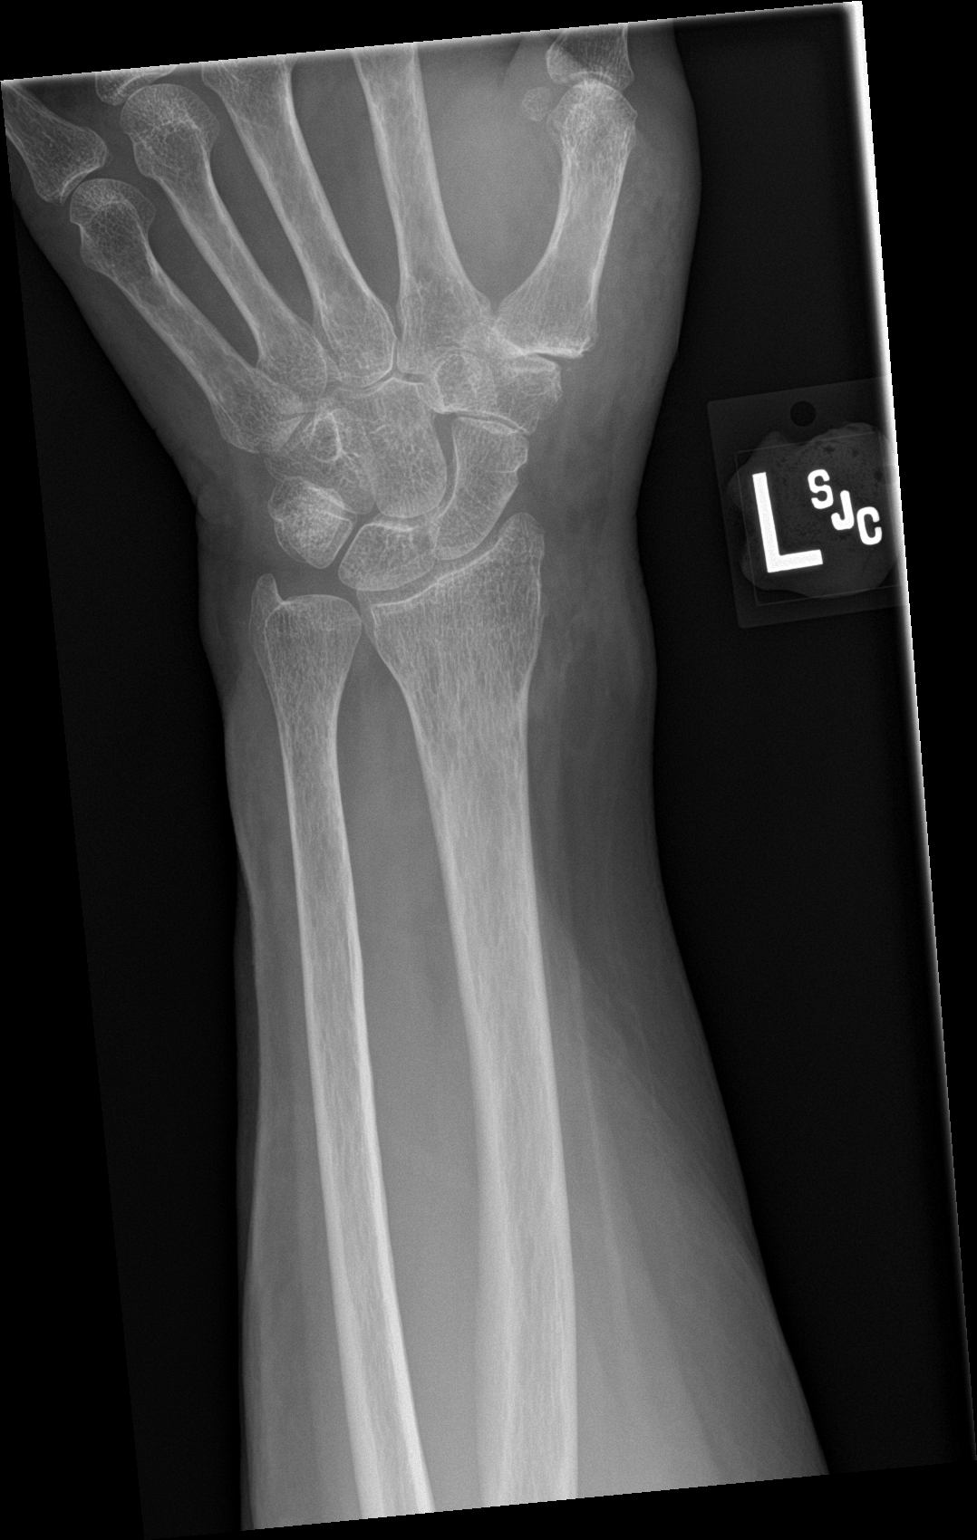

[wrist obl]
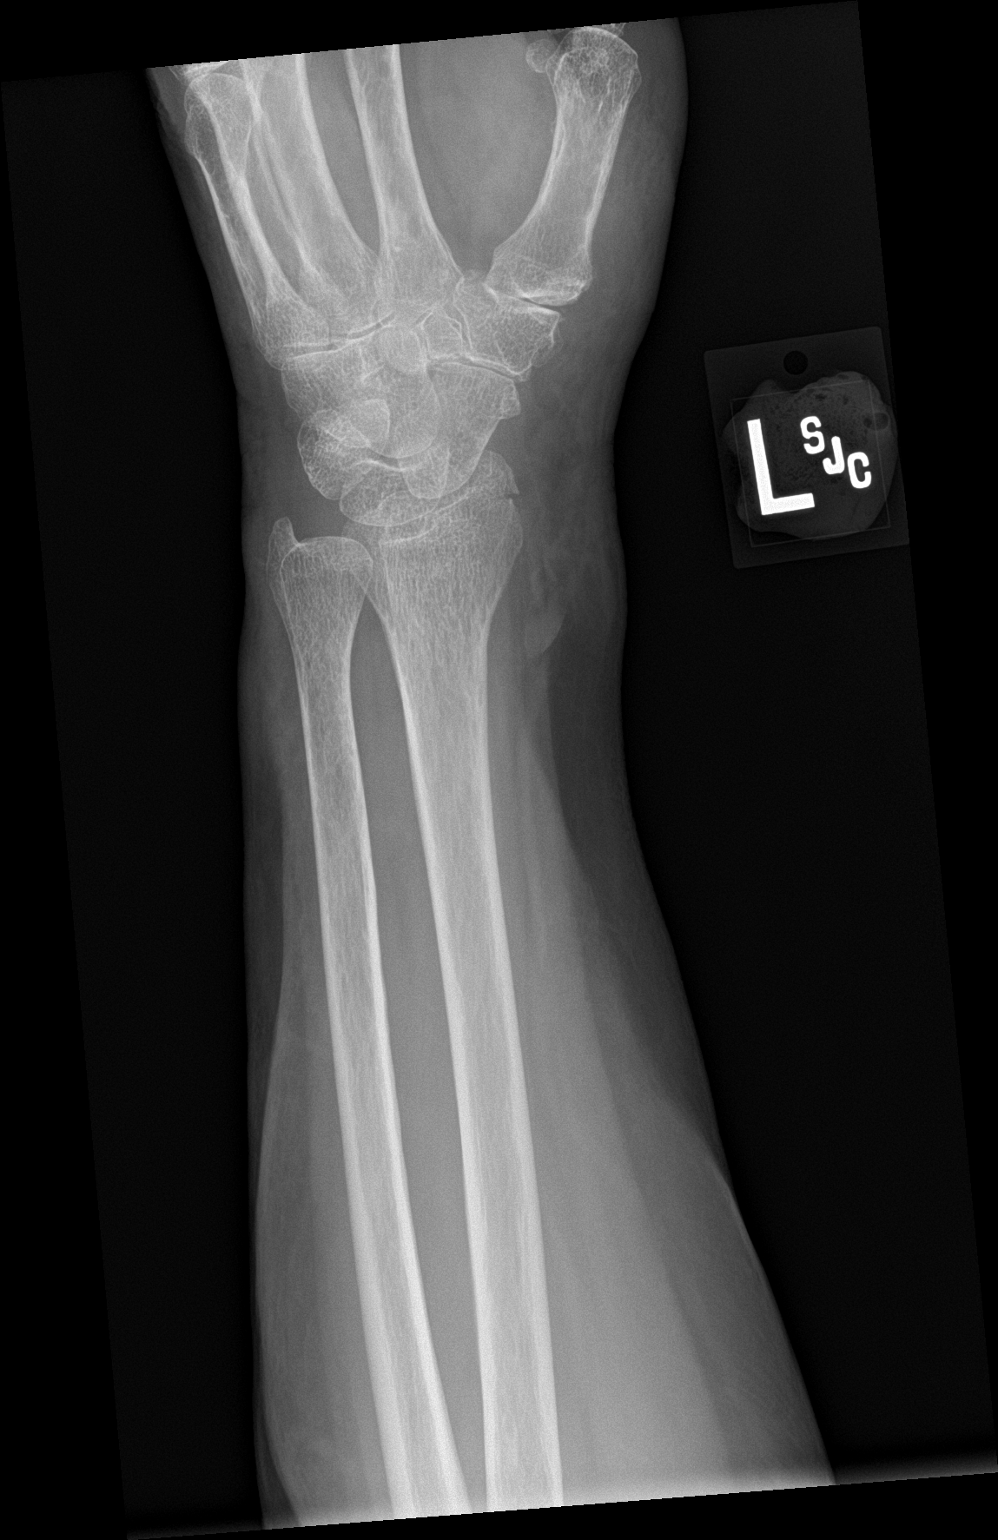

[wrist lat]
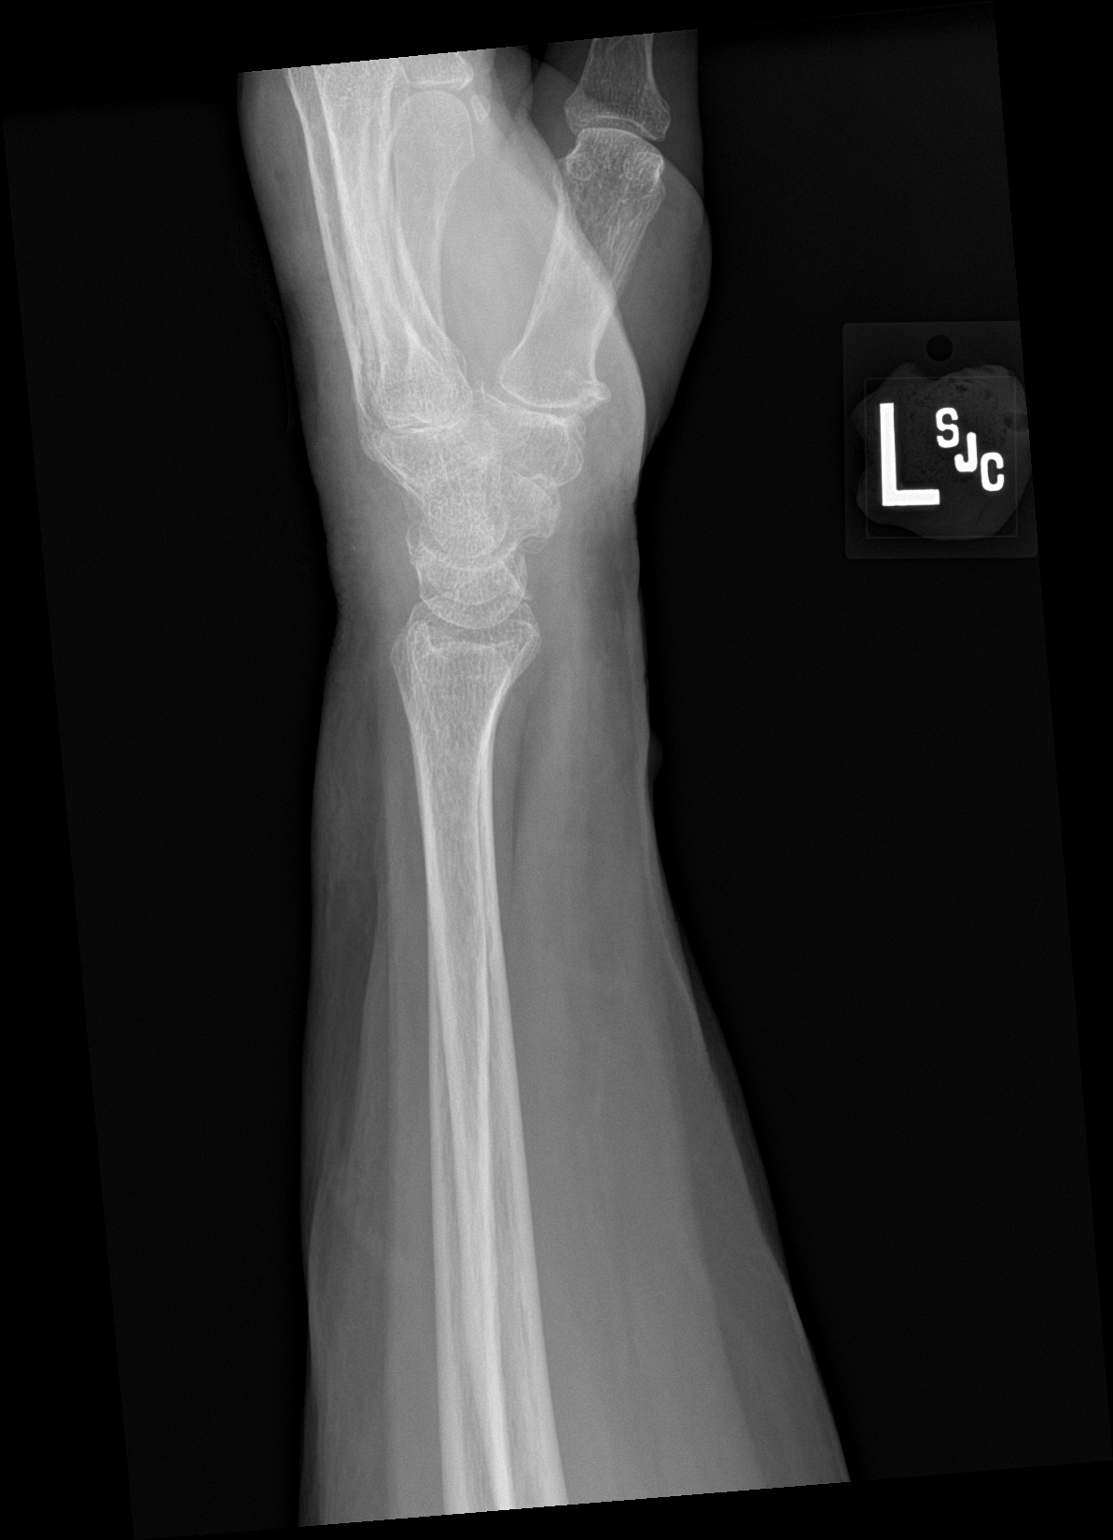

[3 of 3 positions shown; findings below may reference images not displayed]

FINDINGS: There is a nondisplaced intra-articular transverse fracture of the
distal radius.

There is no evidence of subluxation or dislocation.

Soft tissue swelling is present.

Degenerative changes of the first carpometacarpal joint noted.
IMPRESSION: Nondisplaced intraarticular transverse fracture of the distal
radius.

## 2023-04-25 ENCOUNTER — Emergency Department: Payer: Medicare HMO

## 2023-04-25 ENCOUNTER — Other Ambulatory Visit: Payer: Self-pay

## 2023-04-25 ENCOUNTER — Emergency Department
Admission: EM | Admit: 2023-04-25 | Discharge: 2023-04-25 | Disposition: A | Discharge status: Home / Self Care | Payer: Medicare HMO | Attending: Emergency Medicine | Admitting: Emergency Medicine

## 2023-04-25 DIAGNOSIS — W19XXXA Unspecified fall, initial encounter: Secondary | ICD-10-CM | POA: Diagnosis not present

## 2023-04-25 DIAGNOSIS — S0990XA Unspecified injury of head, initial encounter: Secondary | ICD-10-CM | POA: Diagnosis present

## 2023-04-25 DIAGNOSIS — E119 Type 2 diabetes mellitus without complications: Secondary | ICD-10-CM | POA: Insufficient documentation

## 2023-04-25 DIAGNOSIS — E871 Hypo-osmolality and hyponatremia: Secondary | ICD-10-CM | POA: Insufficient documentation

## 2023-04-25 DIAGNOSIS — S0003XA Contusion of scalp, initial encounter: Secondary | ICD-10-CM | POA: Insufficient documentation

## 2023-04-25 DIAGNOSIS — S60221A Contusion of right hand, initial encounter: Secondary | ICD-10-CM | POA: Diagnosis not present

## 2023-04-25 LAB — BASIC METABOLIC PANEL
Anion gap: 9 (ref 5–15)
BUN: 13 mg/dL (ref 8–23)
CO2: 23 mmol/L (ref 22–32)
Calcium: 8.8 mg/dL — ABNORMAL LOW (ref 8.9–10.3)
Chloride: 94 mmol/L — ABNORMAL LOW (ref 98–111)
Creatinine, Ser: 0.63 mg/dL (ref 0.44–1.00)
GFR, Estimated: >60 mL/min (ref >60)
Glucose, Bld: 122 mg/dL — ABNORMAL HIGH (ref 70–99)
Potassium: 4 mmol/L (ref 3.5–5.1)
Sodium: 126 mmol/L — ABNORMAL LOW (ref 135–145)

## 2023-04-25 LAB — CBC WITH DIFFERENTIAL/PLATELET
Abs Immature Granulocytes: 0.03 10*3/uL (ref 0.00–0.07)
Basophils Absolute: 0 10*3/uL (ref 0.0–0.1)
Basophils Relative: 0 %
Eosinophils Absolute: 1.3 10*3/uL — ABNORMAL HIGH (ref 0.0–0.5)
Eosinophils Relative: 13 %
HCT: 32.9 % — ABNORMAL LOW (ref 36.0–46.0)
Hemoglobin: 10.9 g/dL — ABNORMAL LOW (ref 12.0–15.0)
Immature Granulocytes: 0 %
Lymphocytes Relative: 21 %
Lymphs Abs: 2.1 10*3/uL (ref 0.7–4.0)
MCH: 30.4 pg (ref 26.0–34.0)
MCHC: 33.1 g/dL (ref 30.0–36.0)
MCV: 91.6 fL (ref 80.0–100.0)
Monocytes Absolute: 0.8 10*3/uL (ref 0.1–1.0)
Monocytes Relative: 8 %
Neutro Abs: 5.8 10*3/uL (ref 1.7–7.7)
Neutrophils Relative %: 58 %
Platelets: 197 10*3/uL (ref 150–400)
RBC: 3.59 MIL/uL — ABNORMAL LOW (ref 3.87–5.11)
RDW: 13.9 % (ref 11.5–15.5)
WBC: 10.1 10*3/uL (ref 4.0–10.5)
nRBC: 0 % (ref 0.0–0.2)

## 2023-04-25 MED ORDER — LACTATED RINGERS IV BOLUS
1000.0000 mL | Freq: Once | INTRAVENOUS | Status: AC
  Administered 2023-04-25: 1000 mL via INTRAVENOUS

## 2023-04-25 NOTE — ED Provider Notes (Signed)
[facility] Provider Note    Event Date/Time   First MD Initiated Contact with Patient 04/25/23 1924     (approximate)   History   Chief Complaint Fall and Head Injury   HPI  Connie Ortega is a 82 y.o. female with past medical history of diabetes who presents to the ED complaining of fall.  Patient reports that just prior to arrival her left knee gave out on her causing her to fall to the ground.  She states that she struck the back of her head but did not lose consciousness, does take Plavix regularly.  She complains of pain to the back of her head as well as her right hand, where she has noticed some bruising.  She denies any pain in her neck, chest, abdomen, or lower extremities.     Physical Exam   Triage Vital Signs: ED Triage Vitals  Encounter Vitals Group     BP      Systolic BP Percentile      Diastolic BP Percentile      Pulse      Resp      Temp      Temp src      SpO2      Weight      Height      Head Circumference      Peak Flow      Pain Score      Pain Loc      Pain Education      Exclude from Growth Chart     Most recent vital signs: Vitals:   04/25/23 1927  BP: (!) 166/59  Pulse: 77  Resp: 18  Temp: (!) 97.2 F (36.2 C)  SpO2: 95%    Constitutional: Alert and oriented. Eyes: Conjunctivae are normal. Head: Posterior scalp hematoma noted with no overlying laceration. Nose: No congestion/rhinnorhea. Mouth/Throat: Mucous membranes are moist.  Neck: No midline cervical spine tenderness to palpation. Cardiovascular: Normal rate, regular rhythm. Grossly normal heart sounds.  2+ radial pulses bilaterally. Respiratory: Normal respiratory effort.  No retractions. Lungs CTAB.  No chest wall tenderness to palpation. Gastrointestinal: Soft and nontender. No distention. Musculoskeletal: No lower extremity tenderness nor edema.  Tenderness to palpation with ecchymosis over the right hand, no obvious deformity. Neurologic:   Normal speech and language. No gross focal neurologic deficits are appreciated.    ED Results / Procedures / Treatments   Labs (all labs ordered are listed, but only abnormal results are displayed) Labs Reviewed  CBC WITH DIFFERENTIAL/PLATELET - Abnormal; Notable for the following components:      Result Value   RBC 3.59 (*)    Hemoglobin 10.9 (*)    HCT 32.9 (*)    Eosinophils Absolute 1.3 (*)    All other components within normal limits  BASIC METABOLIC PANEL - Abnormal; Notable for the following components:   Sodium 126 (*)    Chloride 94 (*)    Glucose, Bld 122 (*)    Calcium 8.8 (*)    All other components within normal limits     EKG  ED ECG REPORT I, Chesley Noon, the attending physician, personally viewed and interpreted this ECG.   Date: 04/25/2023  EKG Time: 19:32  Rate: 74  Rhythm: normal sinus rhythm  Axis: Normal  Intervals:none  ST&T Change: None  RADIOLOGY CT head reviewed and interpreted by me with no hemorrhage or midline shift.  PROCEDURES:  Critical Care performed: No  Procedures  MEDICATIONS ORDERED IN ED: Medications  lactated ringers bolus 1,000 mL (1,000 mLs Intravenous New Bag/Given 04/25/23 2055)     IMPRESSION / MDM / ASSESSMENT AND PLAN / ED COURSE  I reviewed the triage vital signs and the nursing notes.                              82 y.o. female with past medical history of diabetes who presents to the ED complaining of fall where her left knee gave out on her and she fell backwards, striking her head.  Patient's presentation is most consistent with acute presentation with potential threat to life or bodily function.  Differential diagnosis includes, but is not limited to, intracranial injury, cervical spine injury, hand injury, anemia, electrolyte abnormality, AKI, arrhythmia.  Patient nontoxic-appearing and in no acute distress, vital signs are unremarkable.  EKG shows no evidence of arrhythmia or ischemia, labs show  hyponatremia with sodium of 126 but are otherwise reassuring with no AKI, anemia, or leukocytosis.  CT head and cervical spine are negative for acute process, x-ray of right hand is also unremarkable.  Patient able to ambulate here in the ED with a walker, which is her baseline.  She does not appear to have any symptoms associated with hyponatremia, she does take hydrochlorothiazide which is likely contributing.  She is appropriate for outpatient management and I have counseled her to stop taking hydrochlorothiazide until she follows up with her PCP for recheck of sodium levels.  She was counseled to return to the ED for new worsening symptoms, patient agrees with plan.      FINAL CLINICAL IMPRESSION(S) / ED DIAGNOSES   Final diagnoses:  Fall, initial encounter  Injury of head, initial encounter  Hyponatremia     Rx / DC Orders   ED Discharge Orders     None        Note:  This document was prepared using Dragon voice recognition software and may include unintentional dictation errors.   Chesley Noon, MD 04/25/23 2159

## 2023-04-25 NOTE — ED Notes (Signed)
Assisted pt with bedpan. Family at bedside. 

## 2023-04-25 NOTE — Discharge Instructions (Signed)
You should stop taking your hydrochlorothiazide until you are able to follow-up with your primary care doctor.  This medicine could be contributing to your low sodium levels and they may need to change you to a different blood pressure medicine at that time.  Please return to the ER for reevaluation if you develop any worsening symptoms.

## 2023-04-25 NOTE — ED Triage Notes (Signed)
Patient arrives by EMS from home after a fall.  She was loading the dishwasher, turned to grab her walker, and her left knee buckled.  Patient has a contusion to the right hand, and a large hematoma to the back of her head.  She says that she hit her head, and experienced some dizziness.  Denies nausea/vomiting.  Patient is alert and oriented.

## 2023-04-25 NOTE — ED Notes (Signed)
Urine sent to lab 

## 2023-04-25 NOTE — ED Notes (Signed)
Patient ambulated around room with no difficulty using a walker.  Patient stated that she felt fine in doing this with no dizziness or discomfort.  Family was present in the room during the trial ambulation.  Dr. Larinda Buttery notified.

## 2023-04-25 NOTE — ED Notes (Signed)
This tech and Gerlene Burdock, RN assisted pt off bedpan. Pt slid up in bed; no other needs voiced at this time.

## 2024-10-14 ENCOUNTER — Other Ambulatory Visit: Payer: Self-pay | Admitting: Physician Assistant

## 2024-10-14 ENCOUNTER — Ambulatory Visit
Admission: RE | Admit: 2024-10-14 | Discharge: 2024-10-14 | Disposition: A | Discharge status: Home / Self Care | Source: Ambulatory Visit | Attending: Physician Assistant | Admitting: Physician Assistant

## 2024-10-14 DIAGNOSIS — R112 Nausea with vomiting, unspecified: Secondary | ICD-10-CM | POA: Diagnosis present

## 2024-10-14 MED ORDER — IOHEXOL 300 MG/ML  SOLN
100.0000 mL | Freq: Once | INTRAMUSCULAR | Status: AC | PRN
  Administered 2024-10-14: 100 mL via INTRAVENOUS

## 2024-10-15 ENCOUNTER — Emergency Department

## 2024-10-15 ENCOUNTER — Other Ambulatory Visit: Payer: Self-pay

## 2024-10-15 DIAGNOSIS — I10 Essential (primary) hypertension: Secondary | ICD-10-CM | POA: Diagnosis not present

## 2024-10-15 DIAGNOSIS — I2510 Atherosclerotic heart disease of native coronary artery without angina pectoris: Secondary | ICD-10-CM | POA: Insufficient documentation

## 2024-10-15 DIAGNOSIS — E785 Hyperlipidemia, unspecified: Secondary | ICD-10-CM | POA: Insufficient documentation

## 2024-10-15 DIAGNOSIS — M25551 Pain in right hip: Secondary | ICD-10-CM | POA: Diagnosis present

## 2024-10-15 DIAGNOSIS — Z79899 Other long term (current) drug therapy: Secondary | ICD-10-CM | POA: Diagnosis not present

## 2024-10-15 DIAGNOSIS — Z91040 Latex allergy status: Secondary | ICD-10-CM | POA: Insufficient documentation

## 2024-10-15 DIAGNOSIS — M1611 Unilateral primary osteoarthritis, right hip: Secondary | ICD-10-CM | POA: Insufficient documentation

## 2024-10-15 DIAGNOSIS — E871 Hypo-osmolality and hyponatremia: Secondary | ICD-10-CM | POA: Insufficient documentation

## 2024-10-15 DIAGNOSIS — Z955 Presence of coronary angioplasty implant and graft: Secondary | ICD-10-CM | POA: Diagnosis not present

## 2024-10-15 DIAGNOSIS — E119 Type 2 diabetes mellitus without complications: Secondary | ICD-10-CM | POA: Insufficient documentation

## 2024-10-15 DIAGNOSIS — N390 Urinary tract infection, site not specified: Secondary | ICD-10-CM | POA: Diagnosis not present

## 2024-10-15 DIAGNOSIS — Z7982 Long term (current) use of aspirin: Secondary | ICD-10-CM | POA: Insufficient documentation

## 2024-10-15 DIAGNOSIS — R531 Weakness: Secondary | ICD-10-CM | POA: Diagnosis not present

## 2024-10-15 DIAGNOSIS — G40909 Epilepsy, unspecified, not intractable, without status epilepticus: Secondary | ICD-10-CM | POA: Insufficient documentation

## 2024-10-15 LAB — URINALYSIS, ROUTINE W REFLEX MICROSCOPIC
Bilirubin Urine: NEGATIVE
Glucose, UA: NEGATIVE mg/dL
Ketones, ur: NEGATIVE mg/dL
Nitrite: NEGATIVE
Protein, ur: 100 mg/dL — AB
Specific Gravity, Urine: 1.014 (ref 1.005–1.030)
WBC, UA: >50 WBC/hpf (ref 0–5)
pH: 5 (ref 5.0–8.0)

## 2024-10-15 LAB — CBC WITH DIFFERENTIAL/PLATELET
Abs Immature Granulocytes: 0.03 10*3/uL (ref 0.00–0.07)
Basophils Absolute: 0 10*3/uL (ref 0.0–0.1)
Basophils Relative: 0 %
Eosinophils Absolute: 0.5 10*3/uL (ref 0.0–0.5)
Eosinophils Relative: 7 %
HCT: 27.5 % — ABNORMAL LOW (ref 36.0–46.0)
Hemoglobin: 8.7 g/dL — ABNORMAL LOW (ref 12.0–15.0)
Immature Granulocytes: 0 %
Lymphocytes Relative: 21 %
Lymphs Abs: 1.6 10*3/uL (ref 0.7–4.0)
MCH: 26.7 pg (ref 26.0–34.0)
MCHC: 31.6 g/dL (ref 30.0–36.0)
MCV: 84.4 fL (ref 80.0–100.0)
Monocytes Absolute: 0.8 10*3/uL (ref 0.1–1.0)
Monocytes Relative: 11 %
Neutro Abs: 4.5 10*3/uL (ref 1.7–7.7)
Neutrophils Relative %: 61 %
Platelets: 206 10*3/uL (ref 150–400)
RBC: 3.26 MIL/uL — ABNORMAL LOW (ref 3.87–5.11)
RDW: 14.7 % (ref 11.5–15.5)
WBC: 7.5 10*3/uL (ref 4.0–10.5)
nRBC: 0 % (ref 0.0–0.2)

## 2024-10-15 LAB — COMPREHENSIVE METABOLIC PANEL WITH GFR
ALT: 20 U/L (ref 0–44)
AST: 28 U/L (ref 15–41)
Albumin: 4.1 g/dL (ref 3.5–5.0)
Alkaline Phosphatase: 165 U/L — ABNORMAL HIGH (ref 38–126)
Anion gap: 13 (ref 5–15)
BUN: 21 mg/dL (ref 8–23)
CO2: 22 mmol/L (ref 22–32)
Calcium: 8.7 mg/dL — ABNORMAL LOW (ref 8.9–10.3)
Chloride: 93 mmol/L — ABNORMAL LOW (ref 98–111)
Creatinine, Ser: 0.92 mg/dL (ref 0.44–1.00)
GFR, Estimated: >60 mL/min
Glucose, Bld: 215 mg/dL — ABNORMAL HIGH (ref 70–99)
Potassium: 4.3 mmol/L (ref 3.5–5.1)
Sodium: 127 mmol/L — ABNORMAL LOW (ref 135–145)
Total Bilirubin: <0.2 mg/dL (ref 0.0–1.2)
Total Protein: 7.1 g/dL (ref 6.5–8.1)

## 2024-10-15 NOTE — ED Triage Notes (Signed)
 Pt to ED via EMS from home, pt reports today around lunch time (aprox 12) pt began to have right side hip pain and left finger numbness. Pt denies falls or head trauma. Not taking blood thinners. Pt appears to have left side facial droop. Speech clear. Pt denies other neuro deficits.

## 2024-10-16 ENCOUNTER — Emergency Department

## 2024-10-16 ENCOUNTER — Observation Stay
Admission: EM | Admit: 2024-10-16 | Discharge: 2024-10-17 | Disposition: A | Discharge status: Home Health | Attending: Emergency Medicine | Admitting: Emergency Medicine

## 2024-10-16 DIAGNOSIS — R52 Pain, unspecified: Secondary | ICD-10-CM | POA: Diagnosis not present

## 2024-10-16 DIAGNOSIS — I10 Essential (primary) hypertension: Secondary | ICD-10-CM | POA: Insufficient documentation

## 2024-10-16 DIAGNOSIS — E871 Hypo-osmolality and hyponatremia: Secondary | ICD-10-CM

## 2024-10-16 DIAGNOSIS — M1611 Unilateral primary osteoarthritis, right hip: Secondary | ICD-10-CM

## 2024-10-16 DIAGNOSIS — N3090 Cystitis, unspecified without hematuria: Principal | ICD-10-CM

## 2024-10-16 DIAGNOSIS — N390 Urinary tract infection, site not specified: Secondary | ICD-10-CM | POA: Insufficient documentation

## 2024-10-16 DIAGNOSIS — E119 Type 2 diabetes mellitus without complications: Secondary | ICD-10-CM

## 2024-10-16 DIAGNOSIS — I2510 Atherosclerotic heart disease of native coronary artery without angina pectoris: Secondary | ICD-10-CM

## 2024-10-16 DIAGNOSIS — R531 Weakness: Secondary | ICD-10-CM

## 2024-10-16 DIAGNOSIS — E785 Hyperlipidemia, unspecified: Secondary | ICD-10-CM | POA: Insufficient documentation

## 2024-10-16 LAB — GLUCOSE, CAPILLARY
Glucose-Capillary: 230 mg/dL — ABNORMAL HIGH (ref 70–99)
Glucose-Capillary: 230 mg/dL — ABNORMAL HIGH (ref 70–99)
Glucose-Capillary: 222 mg/dL — ABNORMAL HIGH (ref 70–99)
Glucose-Capillary: 262 mg/dL — ABNORMAL HIGH (ref 70–99)

## 2024-10-16 LAB — PROCALCITONIN: Procalcitonin: 0.11 ng/mL

## 2024-10-16 LAB — HEMOGLOBIN A1C
Hgb A1c MFr Bld: 7.8 % — ABNORMAL HIGH (ref 4.8–5.6)
Mean Plasma Glucose: 177.16 mg/dL

## 2024-10-16 MED ORDER — SODIUM CHLORIDE 0.9 % IV SOLN
2.0000 g | Freq: Once | INTRAVENOUS | Status: AC
  Administered 2024-10-16: 2 g via INTRAVENOUS
  Filled 2024-10-16: qty 20

## 2024-10-16 MED ORDER — SODIUM CHLORIDE 0.9 % IV BOLUS
500.0000 mL | Freq: Once | INTRAVENOUS | Status: AC
  Administered 2024-10-16: 500 mL via INTRAVENOUS

## 2024-10-16 MED ORDER — ONDANSETRON HCL 4 MG PO TABS: 4.0000 mg | ORAL_TABLET | Freq: Four times a day (QID) | ORAL | Status: DC | PRN

## 2024-10-16 MED ORDER — CLOPIDOGREL BISULFATE 75 MG PO TABS
75.0000 mg | ORAL_TABLET | Freq: Every day | ORAL | Status: DC
  Administered 2024-10-16 – 2024-10-17 (×2): 75 mg via ORAL
  Filled 2024-10-16 (×2): qty 1

## 2024-10-16 MED ORDER — ONDANSETRON HCL 4 MG/2ML IJ SOLN: 4.0000 mg | Freq: Four times a day (QID) | INTRAMUSCULAR | Status: DC | PRN

## 2024-10-16 MED ORDER — POLYETHYLENE GLYCOL 3350 17 G PO PACK: 17.0000 g | PACK | Freq: Every day | ORAL | Status: DC | PRN

## 2024-10-16 MED ORDER — PHENYTOIN 50 MG PO CHEW
50.0000 mg | CHEWABLE_TABLET | ORAL | Status: DC
  Administered 2024-10-16: 50 mg via ORAL
  Filled 2024-10-16: qty 1

## 2024-10-16 MED ORDER — ALUM & MAG HYDROXIDE-SIMETH 200-200-20 MG/5ML PO SUSP
30.0000 mL | ORAL | Status: DC | PRN
  Administered 2024-10-16: 30 mL via ORAL
  Filled 2024-10-16: qty 30

## 2024-10-16 MED ORDER — PANTOPRAZOLE SODIUM 40 MG PO TBEC
40.0000 mg | DELAYED_RELEASE_TABLET | Freq: Every day | ORAL | Status: DC
  Administered 2024-10-16: 40 mg via ORAL
  Filled 2024-10-16: qty 1

## 2024-10-16 MED ORDER — ATORVASTATIN CALCIUM 20 MG PO TABS
40.0000 mg | ORAL_TABLET | Freq: Every day | ORAL | Status: DC
  Administered 2024-10-17: 40 mg via ORAL
  Filled 2024-10-16: qty 2

## 2024-10-16 MED ORDER — LORATADINE 10 MG PO TABS
10.0000 mg | ORAL_TABLET | Freq: Every day | ORAL | Status: DC | PRN
  Administered 2024-10-16: 10 mg via ORAL
  Filled 2024-10-16: qty 1

## 2024-10-16 MED ORDER — ASPIRIN 81 MG PO TBEC
81.0000 mg | DELAYED_RELEASE_TABLET | Freq: Every day | ORAL | Status: DC
  Administered 2024-10-16 – 2024-10-17 (×2): 81 mg via ORAL
  Filled 2024-10-16 (×2): qty 1

## 2024-10-16 MED ORDER — ACETAMINOPHEN 500 MG PO TABS
1000.0000 mg | ORAL_TABLET | Freq: Once | ORAL | Status: AC
  Administered 2024-10-16: 1000 mg via ORAL
  Filled 2024-10-16: qty 2

## 2024-10-16 MED ORDER — ALBUTEROL SULFATE (2.5 MG/3ML) 0.083% IN NEBU: 2.5000 mg | INHALATION_SOLUTION | RESPIRATORY_TRACT | Status: DC | PRN

## 2024-10-16 MED ORDER — HYDROCHLOROTHIAZIDE 25 MG PO TABS
25.0000 mg | ORAL_TABLET | Freq: Every day | ORAL | Status: DC
  Administered 2024-10-16: 25 mg via ORAL
  Filled 2024-10-16: qty 1

## 2024-10-16 MED ORDER — METOPROLOL SUCCINATE ER 50 MG PO TB24
150.0000 mg | ORAL_TABLET | Freq: Every day | ORAL | Status: DC
  Administered 2024-10-16 – 2024-10-17 (×2): 150 mg via ORAL
  Filled 2024-10-16 (×2): qty 3

## 2024-10-16 MED ORDER — IRBESARTAN 150 MG PO TABS
75.0000 mg | ORAL_TABLET | Freq: Every day | ORAL | Status: DC
  Administered 2024-10-16 – 2024-10-17 (×2): 75 mg via ORAL
  Filled 2024-10-16 (×2): qty 1

## 2024-10-16 MED ORDER — PHENYTOIN SODIUM EXTENDED 100 MG PO CAPS
100.0000 mg | ORAL_CAPSULE | Freq: Three times a day (TID) | ORAL | Status: DC
  Administered 2024-10-16 – 2024-10-17 (×2): 100 mg via ORAL
  Filled 2024-10-16 (×4): qty 1

## 2024-10-16 MED ORDER — TRAMADOL HCL 50 MG PO TABS
50.0000 mg | ORAL_TABLET | Freq: Four times a day (QID) | ORAL | Status: DC | PRN
  Administered 2024-10-16: 50 mg via ORAL
  Filled 2024-10-16: qty 1

## 2024-10-16 MED ORDER — ESOMEPRAZOLE MAGNESIUM 20 MG PO CPDR: 20.0000 mg | DELAYED_RELEASE_CAPSULE | Freq: Every day | ORAL | Status: DC

## 2024-10-16 MED ORDER — HYDROCODONE-ACETAMINOPHEN 5-325 MG PO TABS: 1.0000 | ORAL_TABLET | ORAL | Status: DC | PRN

## 2024-10-16 MED ORDER — INSULIN ASPART 100 UNIT/ML IJ SOLN
0.0000 [IU] | Freq: Three times a day (TID) | INTRAMUSCULAR | Status: DC
  Administered 2024-10-16 (×3): 3 [IU] via SUBCUTANEOUS
  Administered 2024-10-17: 5 [IU] via SUBCUTANEOUS
  Administered 2024-10-17: 1 [IU] via SUBCUTANEOUS
  Filled 2024-10-16 (×2): qty 3
  Filled 2024-10-16: qty 5
  Filled 2024-10-16: qty 3
  Filled 2024-10-16: qty 1

## 2024-10-16 MED ORDER — GABAPENTIN 300 MG PO CAPS
300.0000 mg | ORAL_CAPSULE | Freq: Three times a day (TID) | ORAL | Status: DC
  Administered 2024-10-16 – 2024-10-17 (×3): 300 mg via ORAL
  Filled 2024-10-16 (×3): qty 1

## 2024-10-16 MED ORDER — ESOMEPRAZOLE MAGNESIUM 20 MG PO CPDR
20.0000 mg | DELAYED_RELEASE_CAPSULE | Freq: Every day | ORAL | Status: DC
  Administered 2024-10-16 – 2024-10-17 (×2): 20 mg via ORAL
  Filled 2024-10-16 (×2): qty 1

## 2024-10-16 MED ORDER — ACETAMINOPHEN 650 MG RE SUPP: 650.0000 mg | Freq: Four times a day (QID) | RECTAL | Status: DC | PRN

## 2024-10-16 MED ORDER — CALCIUM CARBONATE ANTACID 500 MG PO CHEW
1.0000 | CHEWABLE_TABLET | Freq: Three times a day (TID) | ORAL | Status: DC
  Administered 2024-10-16 – 2024-10-17 (×3): 200 mg via ORAL
  Filled 2024-10-16 (×3): qty 1

## 2024-10-16 MED ORDER — TRAZODONE HCL 50 MG PO TABS
50.0000 mg | ORAL_TABLET | Freq: Every day | ORAL | Status: DC
  Administered 2024-10-16: 50 mg via ORAL
  Filled 2024-10-16: qty 1

## 2024-10-16 MED ORDER — SIMVASTATIN 20 MG PO TABS
40.0000 mg | ORAL_TABLET | Freq: Every day | ORAL | Status: DC
  Administered 2024-10-16: 40 mg via ORAL
  Filled 2024-10-16: qty 2

## 2024-10-16 MED ORDER — SODIUM CHLORIDE 0.9 % IV SOLN
2.0000 g | INTRAVENOUS | Status: DC
  Administered 2024-10-17: 2 g via INTRAVENOUS
  Filled 2024-10-16: qty 20

## 2024-10-16 MED ORDER — INSULIN ASPART 100 UNIT/ML IJ SOLN
0.0000 [IU] | Freq: Every day | INTRAMUSCULAR | Status: DC
  Administered 2024-10-16: 3 [IU] via SUBCUTANEOUS
  Filled 2024-10-16: qty 3

## 2024-10-16 MED ORDER — NON FORMULARY: 20.0000 mg | Freq: Every day | Status: DC

## 2024-10-16 MED ORDER — ENOXAPARIN SODIUM 40 MG/0.4ML IJ SOSY: 40.0000 mg | PREFILLED_SYRINGE | INTRAMUSCULAR | Status: DC

## 2024-10-16 MED ORDER — MORPHINE SULFATE (PF) 2 MG/ML IV SOLN
2.0000 mg | INTRAVENOUS | Status: DC | PRN
  Administered 2024-10-16: 2 mg via INTRAVENOUS
  Filled 2024-10-16: qty 1

## 2024-10-16 MED ORDER — ENOXAPARIN SODIUM 60 MG/0.6ML IJ SOSY
0.5000 mg/kg | PREFILLED_SYRINGE | INTRAMUSCULAR | Status: DC
  Administered 2024-10-16 – 2024-10-17 (×2): 55 mg via SUBCUTANEOUS
  Filled 2024-10-16 (×2): qty 0.6

## 2024-10-16 MED ORDER — ACETAMINOPHEN 325 MG PO TABS
650.0000 mg | ORAL_TABLET | Freq: Four times a day (QID) | ORAL | Status: DC | PRN
  Administered 2024-10-16: 650 mg via ORAL
  Filled 2024-10-16: qty 2

## 2024-10-16 NOTE — Progress Notes (Signed)
 Anticoagulation monitoring(Lovenox ):  84 yo female ordered Lovenox  40 mg Q24h    Filed Weights   10/16/24 0605  Weight: 112 kg (247 lb)   BMI 43.8    Lab Results  Component Value Date   CREATININE 0.92 10/15/2024   CREATININE 0.63 04/25/2023   CREATININE 0.70 03/07/2014   Estimated Creatinine Clearance: 55.7 mL/min (by C-G formula based on SCr of 0.92 mg/dL). Hemoglobin & Hematocrit     Component Value Date/Time   HGB 8.7 (L) 10/15/2024 2211   HGB 11.3 (L) 03/07/2014 2056   HCT 27.5 (L) 10/15/2024 2211   HCT 34.2 (L) 03/07/2014 2056     Per Protocol for Patient with estCrcl > 30 ml/min and BMI > 30, will transition to Lovenox  55 mg Q24h.

## 2024-10-16 NOTE — Evaluation (Signed)
 Occupational Therapy Evaluation Patient Details Name: Connie Ortega MRN: [id number] DOB: 14-Feb-1941 Today's Date: 10/16/2024   History of Present Illness   HERAN CAMPAU is a pleasant 84 y.o. female with medical history significant for diabetes, HTN, CAD s/p PCI and stent to proximal LAD and mid RCA at [facility] 11/05/2003, HLD, GERD mildly dilated ascending aorta at 3.3 cm who was brought into ED complaining of generalized weakness, numbness and right hip pain began earlier today at home. All imaging negative for fracture. Reveals severe arthritis.     Clinical Impressions Ms. Skowron  was seen for OT/PT co-evaluation this date. Prior to hospital admission, pt was residing at home alone. Her daughter typically visits weekly to assist her with bathing and IADL management. Pt is able to perform dressing independently and ambulates HH distances with a 5TT. Pt presents with deficits in strength, activity tolerance, pain management, and LB access limiting her ability to perform ADL management at baseline level. Pt currently requires MOD A +2 for bed mobility, MAX A for LB dressing, CGA for safety for STS t/fs and to take side steps toward HOB. Pt would benefit from skilled OT services to address noted impairments and functional limitations (see below for any additional details) in order to maximize safety and independence while minimizing future risk of falls, injury, and readmission. Anticipate the need for follow up OT services in the home setting upon acute hospital DC.      If plan is discharge home, recommend the following:   A little help with walking and/or transfers;A little help with bathing/dressing/bathroom;Help with stairs or ramp for entrance;Assist for transportation;Assistance with cooking/housework     Functional Status Assessment   Patient has had a recent decline in their functional status and demonstrates the ability to make significant improvements in function in a  reasonable and predictable amount of time.     Equipment Recommendations   None recommended by OT     Recommendations for Other Services         Precautions/Restrictions   Precautions Precautions: Fall Recall of Precautions/Restrictions: Intact Restrictions Weight Bearing Restrictions Per Provider Order: No     Mobility Bed Mobility Overal bed mobility: Needs Assistance Bed Mobility: Supine to Sit, Sit to Supine     Supine to sit: Mod assist, +2 for physical assistance, HOB elevated, Used rails Sit to supine: Mod assist, +2 for physical assistance        Transfers Overall transfer level: Needs assistance Equipment used: Rolling walker (2 wheels) Transfers: Sit to/from Stand Sit to Stand: Contact guard assist                  Balance Overall balance assessment: Modified Independent, No apparent balance deficits (not formally assessed)                                         ADL either performed or assessed with clinical judgement   ADL Overall ADL's : Needs assistance/impaired                                       General ADL Comments: Pt functionally limited by generalized weakness, decreased activity tolerance, and limited LB Access. Requires MAX A to doff/don bilat hospital socks at EOB. MOD A +2 for bed mobility, CGA for STS t/f  and to take side steps toward EOB. SET UP assist for seated UB Grooming. Pt limited by pain this date, declines further functional activity.     Vision Patient Visual Report: No change from baseline       Perception         Praxis         Pertinent Vitals/Pain Pain Assessment Pain Assessment: 0-10 Pain Score: 10-Worst pain ever Pain Location: R hip pain and epigastric pain Pain Descriptors / Indicators: Discomfort, Sore Pain Intervention(s): Limited activity within patient's tolerance, Monitored during session, Premedicated before session     Extremity/Trunk Assessment  Upper Extremity Assessment Upper Extremity Assessment: Generalized weakness   Lower Extremity Assessment Lower Extremity Assessment: Generalized weakness RLE Coordination: decreased gross motor   Cervical / Trunk Assessment Cervical / Trunk Assessment: Normal   Communication Communication Communication: No apparent difficulties   Cognition Arousal: Alert Behavior During Therapy: WFL for tasks assessed/performed               OT - Cognition Comments: Requires some encouragement for mobility, but otherwise plesant, oriented to self, situation, and place.                 Following commands: Intact       Cueing  General Comments   Cueing Techniques: Verbal cues      Exercises Other Exercises Other Exercises: Pt educated on role of OT in acute setting, safety, falls prevention, and DC recs.   Shoulder Instructions      Home Living Family/patient expects to be discharged to:: Private residence Living Arrangements: Alone (Has been staying at daughter's home due to the recent ice/snow.) Available Help at Discharge: Available PRN/intermittently;Family Type of Home: House       Home Layout: One level     Bathroom Shower/Tub: Producer, Television/film/video: Standard     Home Equipment: Rollator (4 wheels)   Additional Comments: patient has been staying at daughter's home since snow last weekend. Otherwise lives alone      Prior Functioning/Environment Prior Level of Function : Needs assist             Mobility Comments: Uses 4WW for HH distances. ADLs Comments: Dtr assists with bathing weekly, does grocery shopping. Sons assist on PRN basis as well. Pt typically stays home limited activity at baseline.    OT Problem List: Decreased strength;Decreased coordination;Impaired balance (sitting and/or standing);Decreased knowledge of use of DME or AE;Decreased safety awareness;Decreased activity tolerance;Decreased range of motion;Pain   OT  Treatment/Interventions: Self-care/ADL training;Therapeutic exercise;Therapeutic activities;Balance training;DME and/or AE instruction;Patient/family education;Energy conservation      OT Goals(Current goals can be found in the care plan section)   Acute Rehab OT Goals Patient Stated Goal: To go home OT Goal Formulation: With patient Time For Goal Achievement: 10/30/24 Potential to Achieve Goals: Good ADL Goals Pt Will Perform Grooming: standing;with modified independence Pt Will Perform Lower Body Bathing: sitting/lateral leans;with set-up;with supervision;with caregiver independent in assisting Pt Will Perform Lower Body Dressing: sit to/from stand;with set-up;with supervision;with caregiver independent in assisting   OT Frequency:  Min 2X/week    Co-evaluation   Reason for Co-Treatment: For patient/therapist safety;To address functional/ADL transfers PT goals addressed during session: Mobility/safety with mobility        AM-PAC OT 6 Clicks Daily Activity     Outcome Measure Help from another person eating meals?: None Help from another person taking care of personal grooming?: A Little Help from another person toileting, which includes  using toliet, bedpan, or urinal?: A Little Help from another person bathing (including washing, rinsing, drying)?: A Little Help from another person to put on and taking off regular upper body clothing?: A Little Help from another person to put on and taking off regular lower body clothing?: A Little 6 Click Score: 19   End of Session Equipment Utilized During Treatment: Gait belt;Rolling walker (2 wheels)  Activity Tolerance: Patient tolerated treatment well Patient left: in bed;with call bell/phone within reach;with bed alarm set  OT Visit Diagnosis: Other abnormalities of gait and mobility (R26.89);Muscle weakness (generalized) (M62.81);Pain Pain - Right/Left: Right Pain - part of body: Hip                Time: 1414-1431 OT Time  Calculation (min): 17 min Charges:  OT General Charges $OT Visit: 1 Visit OT Evaluation $OT Eval Moderate Complexity: 1 Mod  Jhonny Pelton, M.S., OTR/L 10/16/24, 3:36 PM

## 2024-10-16 NOTE — Progress Notes (Signed)
 PHARMACIST - PHYSICIAN COMMUNICATION  CONCERNING:  Enoxaparin  (Lovenox ) for DVT Prophylaxis    RECOMMENDATION: Patient was prescribed enoxaprin 40mg  q24 hours for VTE prophylaxis.   Filed Weights   10/16/24 0605 10/16/24 0649  Weight: 112 kg (247 lb) 112 kg (247 lb)    Body mass index is 43.75 kg/m.  Estimated Creatinine Clearance: 55.7 mL/min (by C-G formula based on SCr of 0.92 mg/dL).   Based on [facility] policy patient is candidate for enoxaparin  0.5mg /kg TBW SQ every 24 hours based on BMI being >30.   DESCRIPTION: Pharmacy has adjusted enoxaparin  dose per [facility] policy.  Patient is now receiving enoxaparin  55 mg every 24 hours    Lum VEAR Mania, PharmD Clinical Pharmacist  10/16/2024 8:13 AM

## 2024-10-16 NOTE — H&P (Signed)
 "  History and Physical    Connie Ortega FMW:[id number] DOB: November 11, 1940 DOA: 10/16/2024  DOS: the patient was seen and examined on 10/16/2024  PCP: Marikay Eva POUR, PA   Patient coming from: Home  I have personally briefly reviewed patient's old medical records in [facility]  Chief Complaint: Right hip pain and numbness  HPI: Connie Ortega is a pleasant 84 y.o. female with medical history significant for diabetes, HTN, CAD s/p PCI and stent to proximal LAD and mid RCA at [facility] 11/05/2003, HLD, GERD mildly dilated ascending aorta at 3.3 cm who was brought into ED complaining of generalized weakness, numbness and right hip pain began earlier today at home.  She denies any falls or trauma.  No chest pain, shortness of breath, palpitations, fever, chills, cough or back pain.  She complains of burning urination over the past week.  No nausea no vomiting.  ED Course: Upon arrival to the ED, patient is found to be hypertension at 181/74, x-ray of the hip negative for fracture or dislocation.  CT right hip shows extensive arthritis and chronic tissue disease no fractures.  Urine analysis showed UA positive for infection.  Hospitalist service was consulted for evaluation for admission for hip pain and UTI.  Review of Systems:  ROS  All other systems negative except as noted in the HPI.  Past Medical History:  Diagnosis Date   Diabetes mellitus without complication (HCC)     Past Surgical History:  Procedure Laterality Date   ABDOMINAL HYSTERECTOMY     APPENDECTOMY     BREAST BIOPSY Right 20+ yrs ago   EXCISIONAL - NEG   BREAST BIOPSY Right 20+ yrs ago   EXCISIONAL - NEG   CARDIAC CATHETERIZATION     2 stents in place   CHOLECYSTECTOMY       reports that she has never smoked. She does not have any smokeless tobacco history on file. She reports that she does not drink alcohol and does not use drugs.  Allergies[1]  Family History  Problem Relation Age of Onset   Breast  cancer Neg Hx     Prior to Admission medications  Medication Sig Start Date End Date Taking? Authorizing Provider  aspirin  81 MG tablet Take 81 mg by mouth daily.   Yes [provider]  azelastine (ASTELIN) 0.1 % nasal spray Place 1 spray into both nostrils 2 (two) times daily. Use in each nostril as directed   Yes [provider]  Calcium  Carb-Cholecalciferol (OYSTER SHELL CALCIUM  W/D) 500-5 MG-MCG TABS Take 1 tablet by mouth 2 (two) times daily with a meal.   Yes [provider]  clopidogrel  (PLAVIX ) 75 MG tablet Take 75 mg by mouth daily.   Yes [provider]  Cyanocobalamin 1000 MCG SUBL Take 1 tablet by mouth daily.   Yes [provider]  FLUoxetine (PROZAC) 10 MG capsule Take 20 mg by mouth daily.   Yes [provider]  gabapentin  (NEURONTIN ) 300 MG capsule Take 300 mg by mouth 3 (three) times daily.   Yes [provider]  glipiZIDE (GLUCOTROL) 5 MG tablet Take 5 mg by mouth daily before breakfast.   Yes [provider]  metFORMIN (GLUCOPHAGE) 1000 MG tablet Take 1,000 mg by mouth 2 (two) times daily with a meal. Take one tablet in the morning and 1.5 tablet at supper   Yes [provider]  metoprolol  succinate (TOPROL -XL) 100 MG 24 hr tablet Take 1.5 tablets by mouth daily. 01/26/23  Yes [provider]  nystatin (MYCOSTATIN/NYSTOP) powder Apply 1 Application topically 3 (three) times daily.   Yes [provider]  pantoprazole  (PROTONIX ) 40 MG tablet Take 40 mg by mouth daily.   Yes [provider]  phenytoin  (DILANTIN ) 100 MG ER capsule Take 100 mg by mouth 3 (three) times daily.   Yes [provider]  phenytoin  (DILANTIN ) 50 MG tablet Chew 50 mg by mouth every other day.   Yes [provider]  telmisartan (MICARDIS) 80 MG tablet Take 80 mg by mouth daily.   Yes [provider]  traMADol  (ULTRAM ) 50 MG tablet Take 50 mg by mouth every 6 (six) hours as  needed.   Yes [provider]  traZODone  (DESYREL ) 50 MG tablet Take 50 mg by mouth at bedtime.   Yes [provider]  alendronate (FOSAMAX) 70 MG tablet Take 70 mg by mouth once a week. Take with a full glass of water on an empty stomach. Patient not taking: Reported on 10/16/2024    [provider]  fluticasone (FLONASE) 50 MCG/ACT nasal spray Place 2 sprays into both nostrils daily.    [provider]  hydrochlorothiazide  (HYDRODIURIL ) 25 MG tablet Take 25 mg by mouth daily.    [provider]  Phenazopyridine HCl (AZO TABS PO) Take 1 tablet by mouth in the morning and at bedtime.    [provider]  simvastatin  (ZOCOR ) 40 MG tablet Take 40 mg by mouth daily.    [provider]    Physical Exam: Vitals:   10/16/24 0605 10/16/24 0611 10/16/24 0649 10/16/24 0737  BP:  (!) 183/65 (!) 169/92 (!) 174/56  Pulse:   95 95  Resp:   19 18  Temp:   97.8 F (36.6 C) 97.8 F (36.6 C)  TempSrc:      SpO2:   97% 97%  Weight: 112 kg  112 kg   Height: 5' 3 (1.6 m)  5' 3 (1.6 m)     Physical Exam   Constitutional: Alert, awake, calm, comfortable HEENT: Neck supple Respiratory: Clear to auscultation B/L, no wheezing, no rales.  Cardiovascular: Regular rate and rhythm, no murmurs / rubs / gallops. No extremity edema. 2+ pedal pulses. No carotid bruits.  Abdomen: Soft, no tenderness, Bowel sounds positive.  Musculoskeletal: no clubbing / cyanosis. Good ROM, no contractures. Normal muscle tone.  Skin: no rashes, lesions, ulcers. Neurologic: CN 2-12 grossly intact. Sensation intact, No focal deficit identified Psychiatric: Alert and oriented x 3. Normal mood.    Labs on Admission: I have personally reviewed following labs and imaging studies  CBC: Recent Labs  Lab 10/15/24 2211  WBC 7.5  NEUTROABS 4.5  HGB 8.7*  HCT 27.5*  MCV 84.4  PLT 206   Basic Metabolic Panel: Recent Labs  Lab 10/15/24 2211  NA 127*  K 4.3  CL  93*  CO2 22  GLUCOSE 215*  BUN 21  CREATININE 0.92  CALCIUM  8.7*   GFR: Estimated Creatinine Clearance: 55.7 mL/min (by C-G formula based on SCr of 0.92 mg/dL). Liver Function Tests: Recent Labs  Lab 10/15/24 2211  AST 28  ALT 20  ALKPHOS 165*  BILITOT <0.2  PROT 7.1  ALBUMIN 4.1   No results for input(s): LIPASE, AMYLASE in the last 168 hours. No results for input(s): AMMONIA in the last 168 hours. Coagulation Profile: No results for input(s): INR, PROTIME in the last 168 hours. Cardiac Enzymes: No results for input(s): CKTOTAL, CKMB, CKMBINDEX, TROPONINI, TROPONINIHS in  the last 168 hours. BNP (last 3 results) No results for input(s): BNP in the last 8760 hours. HbA1C: No results for input(s): HGBA1C in the last 72 hours. CBG: Recent Labs  Lab 10/16/24 0734  GLUCAP 230*   Lipid Profile: No results for input(s): CHOL, HDL, LDLCALC, TRIG, CHOLHDL, LDLDIRECT in the last 72 hours. Thyroid Function Tests: No results for input(s): TSH, T4TOTAL, FREET4, T3FREE, THYROIDAB in the last 72 hours. Anemia Panel: No results for input(s): VITAMINB12, FOLATE, FERRITIN, TIBC, IRON, RETICCTPCT in the last 72 hours. Urine analysis:    Component Value Date/Time   COLORURINE YELLOW (A) 10/15/2024 2211   APPEARANCEUR TURBID (A) 10/15/2024 2211   APPEARANCEUR Clear 03/08/2012 0054   LABSPEC 1.014 10/15/2024 2211   LABSPEC 1.008 03/08/2012 0054   PHURINE 5.0 10/15/2024 2211   GLUCOSEU NEGATIVE 10/15/2024 2211   GLUCOSEU Negative 03/08/2012 0054   HGBUR SMALL (A) 10/15/2024 2211   BILIRUBINUR NEGATIVE 10/15/2024 2211   BILIRUBINUR Negative 03/08/2012 0054   KETONESUR NEGATIVE 10/15/2024 2211   PROTEINUR 100 (A) 10/15/2024 2211   NITRITE NEGATIVE 10/15/2024 2211   LEUKOCYTESUR LARGE (A) 10/15/2024 2211   LEUKOCYTESUR Negative 03/08/2012 0054    Radiological Exams on Admission: I have personally reviewed images CT Hip  Right Wo Contrast Result Date: 10/16/2024 EXAM: CT OF THE RIGHT HIP WITHOUT IV CONTRAST 10/16/2024 03:14:20 AM TECHNIQUE: CT of the right hip was performed without the administration of intravenous contrast. Multiplanar reformatted images are provided for review. Automated exposure control, iterative reconstruction, and/or weight based adjustment of the mA/kV was utilized to reduce the radiation dose to as low as reasonably achievable. COMPARISON: None available. CLINICAL HISTORY: Soft tissue infection suspected, hip, x-ray done. FINDINGS: BONES: No acute fracture or dislocation. No lytic or blastic bone lesion. No aggressive appearing osseous abnormality or periostitis. Mild degenerative changes noted at the Pubic Symphysis and visualized right Sacroiliac Joint. Degenerative enthesopathy seen involving the insertion of the Gluteal Tendons upon the Greater Trochanter. SOFT TISSUE: Moderate fatty atrophy of the Gluteal musculature. No loculated subcutaneous fluid collection identified. No localized subcutaneous edema or subcutaneous gas identified. No soft tissue mass. No pathologic adenopathy. Vascular calcifications. JOINT: Severe right hip degenerative arthritis. No osseous erosions. INTRAPELVIC CONTENTS: Moderate diverticulosis of the visualized Sigmoid Colon. Limited images of the intrapelvic contents are otherwise unremarkable. IMPRESSION: 1. No CT evidence of soft tissue infection about the right hip, without subcutaneous gas, edema, or loculated fluid collection. 2. No acute fracture or dislocation. 3. Severe right hip degenerative arthritis, with mild degenerative changes at the pubic symphysis and visualized right sacroiliac joint, and degenerative enthesopathy at the gluteal tendon insertions on the greater trochanter. 4. Moderate fatty atrophy of the gluteal musculature. 5. Moderate sigmoid diverticulosis. 6. Vascular calcifications. Electronically signed by: Dorethia Molt MD 10/16/2024 03:52 AM EST RP  Workstation: [id number]   DG Hip Unilat W or Wo Pelvis 2-3 Views Right Result Date: 10/15/2024 EXAM: 2 or 3 VIEW(S) XRAY OF THE RIGHT HIP 10/15/2024 10:51:00 PM COMPARISON: None available. CLINICAL HISTORY: Pain. FINDINGS: BONES AND JOINTS: No acute fracture. No malalignment. Heterotopic calcification along the right greater trochanter. Moderate degenerative changes of the right hip. Mild degenerative changes of the left hip. LUMBAR SPINE: Degenerative changes of the partially visualized lumbar spine. SOFT TISSUES: Vascular calcifications. IMPRESSION: 1. No acute findings. Electronically signed by: Morgane Naveau MD 10/15/2024 11:00 PM EST RP Workstation: [id number]   CT Head Wo Contrast Result Date: 10/15/2024 EXAM: CT HEAD WITHOUT CONTRAST 10/15/2024 10:42:52 PM TECHNIQUE: CT  of the head was performed without the administration of intravenous contrast. Automated exposure control, iterative reconstruction, and/or weight based adjustment of the mA/kV was utilized to reduce the radiation dose to as low as reasonably achievable. COMPARISON: 04/25/2023 CLINICAL HISTORY: Numbness. FINDINGS: BRAIN AND VENTRICLES: No acute hemorrhage. No evidence of acute infarct. Patchy and confluent areas of decreased attenuation are noted throughout the deep and periventricular white matter of the cerebral hemispheres bilaterally suggestive of chronic microvascular ischemic changes. No hydrocephalus. No extra-axial collection. No mass effect or midline shift. Atherosclerotic calcifications are present within the cavernous internal carotid arteries. ORBITS: No acute abnormality. SINUSES: Partial left mastoid air cell effusion. SOFT TISSUES AND SKULL: No acute soft tissue abnormality. No skull fracture. IMPRESSION: 1. No acute intracranial abnormality. Electronically signed by: Morgane Naveau MD 10/15/2024 10:58 PM EST RP Workstation: [id number]   CT ABDOMEN PELVIS W CONTRAST Result Date: 10/14/2024 EXAM: CT ABDOMEN AND PELVIS  WITH CONTRAST 10/14/2024 05:25:54 PM TECHNIQUE: CT of the abdomen and pelvis was performed with the administration of 100 mL of iohexol  (OMNIPAQUE ) 300 MG/ML solution. Multiplanar reformatted images are provided for review. Automated exposure control, iterative reconstruction, and/or weight-based adjustment of the mA/kV was utilized to reduce the radiation dose to as low as reasonably achievable. COMPARISON: Comparison with 03/08/2011. CLINICAL HISTORY: Nausea and vomiting. FINDINGS: LOWER CHEST: Mild atelectasis in the lung bases. LIVER: The liver is unremarkable. GALLBLADDER AND BILE DUCTS: Gallbladder is surgically absent. No biliary ductal dilatation. SPLEEN: No acute abnormality. PANCREAS: No acute abnormality. ADRENAL GLANDS: No acute abnormality. KIDNEYS, URETERS AND BLADDER: No stones in the kidneys or ureters. No hydronephrosis. No perinephric or periureteral stranding. Urinary bladder is unremarkable. GI AND BOWEL: The esophagus is mildly dilated with a thickened wall. This may be due to reflux disease or esophagitis. No discrete mass lesion is identified. The stomach, small bowel, and colon are mostly decompressed. Scattered stool throughout the colon. No wall thickening or inflammatory stranding is demonstrated. Appendix is not identified. There is no bowel obstruction. PERITONEUM AND RETROPERITONEUM: No ascites. No free air. VASCULATURE: Calcification of the aorta. No aneurysm. Aorta is normal in caliber. LYMPH NODES: No lymphadenopathy. REPRODUCTIVE ORGANS: The uterus is surgically absent. No abnormal adnexal masses are present. BONES AND SOFT TISSUES: Degenerative changes in the spine. No acute bony abnormalities. Degenerative changes in the hips. Mild sclerosis in the superior femoral heads may be degenerative or early avascular necrosis. No focal soft tissue abnormality. IMPRESSION: 1. Mildly dilated esophagus with thickened wall, which may reflect reflux disease or esophagitis. No discrete mass  lesion identified. 2. Decompressed stomach, small bowel, and colon with scattered stool throughout the colon. No wall thickening or inflammatory stranding. Electronically signed by: Elsie Gravely MD 10/14/2024 05:59 PM EST RP Workstation: [id number]    EKG: N/A    Assessment/Plan Principal Problem:   Intractable pain Active Problems:   UTI (urinary tract infection)   DM (diabetes mellitus) (HCC)   HTN (hypertension)   CAD S/P percutaneous coronary angioplasty   HLD (hyperlipidemia)    Assessment and Plan: 84 year old female who was brought into ED for right hip pain and urinary symptoms.  1.  Right hip pain - Workup with x-ray and CT scan was negative for acute fractures or dislocation. - Patient has osteoarthritis. - Patient will be given pain medications, PT/OT.  2.  Urinary tract infection - She was started on ceftriaxone  and urine cultures have been sent - Continue on ceftriaxone  and follow cultures.  3.  HTN/HLD - Resume her home  medications metoprolol  at home dose 150 mg daily, irbesartan , statin simvastatin  40 mg daily - Continue to monitor blood pressure  4.  DM-continue insulin  sliding scale  5.  CAD s/p stenting - Resume her home medications  6.  Seizure disorder - Patient takes Dilantin . - Check Dilantin  level - Continue Dilantin  at home dose   DVT prophylaxis: Lovenox  Code Status: Full Code Family Communication: Family was present at bedside Disposition Plan: Home versus rehab Consults called: PT/OT Admission status: Observation, Med-Surg   Nena Rebel, MD [facility] 10/16/2024, 10:45 AM        [1]  Allergies Allergen Reactions   Augmentin [Amoxicillin-Pot Clavulanate] Itching   Ciprofloxacin Nausea And Vomiting   Codeine Sulfate [Codeine] Other (See Comments)   Demerol Hcl [Meperidine] Itching   Doxycycline Itching   Latex Hives   Talwin [Pentazocine] Itching   Butalbital Rash   Lodine [Etodolac] Rash   Macrobid  [Nitrofurantoin Monohyd Macro] Rash   Tape Rash   "

## 2024-10-16 NOTE — Evaluation (Signed)
 Physical Therapy Evaluation Patient Details Name: Connie Ortega MRN: [id number] DOB: [birth year] Today's Date: 10/16/2024  History of Present Illness  Connie Ortega is a pleasant 84 y.o. female with medical history significant for diabetes, HTN, CAD s/p PCI and stent to proximal LAD and mid RCA at [facility] 11/05/2003, HLD, GERD mildly dilated ascending aorta at 3.3 cm who was brought into ED complaining of generalized weakness, numbness and right hip pain began earlier today at home. All imaging negative for fracture. Reveals severe arthritis.   Clinical Impression  Patient received in bed, reports she is not feeling well. Reports epigastric pain and R hip pain. 7/10 in chest and 10/10 R hip, however moves fairly well. Patient requires mod +2 assist for bed mobility. Min/cga for sit to stand and min A for side stepping at edge of bed. She requires a lot of encouragement to participate and is somewhat self limiting. Patient will continue to benefit from skilled PT to improve mobility and independence.          If plan is discharge home, recommend the following: A little help with walking and/or transfers;A little help with bathing/dressing/bathroom;Assist for transportation;Help with stairs or ramp for entrance   Can travel by private vehicle    Yes with A    Equipment Recommendations None recommended by PT  Recommendations for Other Services       Functional Status Assessment Patient has had a recent decline in their functional status and demonstrates the ability to make significant improvements in function in a reasonable and predictable amount of time.     Precautions / Restrictions Precautions Precautions: Fall Recall of Precautions/Restrictions: Intact Restrictions Weight Bearing Restrictions Per Provider Order: No      Mobility  Bed Mobility Overal bed mobility: Needs Assistance Bed Mobility: Supine to Sit, Sit to Supine     Supine to sit: Mod assist, +2 for physical  assistance, HOB elevated, Used rails Sit to supine: Mod assist, +2 for physical assistance        Transfers Overall transfer level: Needs assistance Equipment used: Rolling walker (2 wheels) Transfers: Sit to/from Stand Sit to Stand: Contact guard assist                Ambulation/Gait Ambulation/Gait assistance: Contact guard assist Gait Distance (Feet): 3 Feet Assistive device: Rolling walker (2 wheels) Gait Pattern/deviations: Step-to pattern, Decreased step length - right, Decreased step length - left Gait velocity: decr     General Gait Details: patient is able to side step at edge of bed with RW and cga/min A.  Stairs            Wheelchair Mobility     Tilt Bed    Modified Rankin (Stroke Patients Only)       Balance Overall balance assessment: Modified Independent                                           Pertinent Vitals/Pain Pain Assessment Pain Assessment: 0-10 Pain Score: 10-Worst pain ever Pain Location: R hip pain and epigastric pain Pain Descriptors / Indicators: Discomfort, Sore Pain Intervention(s): Monitored during session, Repositioned, Premedicated before session    Home Living Family/patient expects to be discharged to:: Private residence Living Arrangements: Alone Available Help at Discharge: Available PRN/intermittently;Family           Home Layout: One level Home Equipment: Rollator (4 wheels)  Additional Comments: patient has been staying at daughter's home since snow last weekend. Otherwise lives alone    Prior Function Prior Level of Function : Needs assist             Mobility Comments: Uses 4WW for HH distances. ADLs Comments: Dtr assists with bathing weekly, does grocery shopping. Sons assist on PRN basis as well.     Extremity/Trunk Assessment   Upper Extremity Assessment Upper Extremity Assessment: Defer to OT evaluation    Lower Extremity Assessment Lower Extremity Assessment:  Generalized weakness;RLE deficits/detail RLE Coordination: decreased gross motor    Cervical / Trunk Assessment Cervical / Trunk Assessment: Normal  Communication   Communication Communication: No apparent difficulties    Cognition Arousal: Alert Behavior During Therapy: WFL for tasks assessed/performed   PT - Cognitive impairments: No apparent impairments                         Following commands: Intact       Cueing Cueing Techniques: Verbal cues     General Comments      Exercises     Assessment/Plan    PT Assessment Patient needs continued PT services  PT Problem List Decreased activity tolerance;Decreased strength;Decreased mobility;Pain;Decreased range of motion       PT Treatment Interventions DME instruction;Gait training;Stair training;Functional mobility training;Therapeutic activities;Therapeutic exercise;Patient/family education    PT Goals (Current goals can be found in the Care Plan section)  Acute Rehab PT Goals Patient Stated Goal: return home, improve pain PT Goal Formulation: With patient Time For Goal Achievement: 10/30/24    Frequency Min 2X/week     Co-evaluation PT/OT/SLP Co-Evaluation/Treatment: Yes Reason for Co-Treatment: For patient/therapist safety;To address functional/ADL transfers PT goals addressed during session: Mobility/safety with mobility         AM-PAC PT 6 Clicks Mobility  Outcome Measure Help needed turning from your back to your side while in a flat bed without using bedrails?: A Lot Help needed moving from lying on your back to sitting on the side of a flat bed without using bedrails?: A Lot Help needed moving to and from a bed to a chair (including a wheelchair)?: A Little Help needed standing up from a chair using your arms (e.g., wheelchair or bedside chair)?: A Little Help needed to walk in hospital room?: A Lot Help needed climbing 3-5 steps with a railing? : A Lot 6 Click Score: 14    End of  Session   Activity Tolerance: Patient limited by fatigue;Patient limited by pain Patient left: in bed;with call bell/phone within reach;with bed alarm set Nurse Communication: Mobility status PT Visit Diagnosis: Other abnormalities of gait and mobility (R26.89);Muscle weakness (generalized) (M62.81);Difficulty in walking, not elsewhere classified (R26.2);Pain Pain - Right/Left: Right Pain - part of body: Hip    Time: 1414-1430 PT Time Calculation (min) (ACUTE ONLY): 16 min   Charges:   PT Evaluation $PT Eval Low Complexity: 1 Low   PT General Charges $$ ACUTE PT VISIT: 1 Visit        Sabrina Keough, PT, GCS 10/16/24,2:40 PM

## 2024-10-16 NOTE — ED Provider Notes (Signed)
 "  [facility] Provider Note    Event Date/Time   First MD Initiated Contact with Patient 10/16/24 [phone]     (approximate)   History   Chief Complaint: Hip Pain and Numbness   HPI  Connie Ortega is a 84 y.o. female with a history of diabetes who comes ED complaining of generalized weakness, as well as right hip pain that began earlier today at home.  No falls or trauma.  No chest pain shortness of breath or fever.  She has been having urinary frequency over the past week.  No back pain.        Past Medical History:  Diagnosis Date   Diabetes mellitus without complication [facility])     Current Outpatient Rx   Order #: [id number] Class: Historical Med   Order #: [id number] Class: Historical Med   Order #: [id number] Class: Historical Med   Order #: [id number] Class: Historical Med   Order #: [id number] Class: Historical Med   Order #: [id number] Class: Historical Med   Order #: [id number] Class: Historical Med   Order #: [id number] Class: Historical Med   Order #: [id number] Class: Historical Med   Order #: [id number] Class: Historical Med   Order #: [id number] Class: Historical Med   Order #: [id number] Class: Historical Med   Order #: [id number] Class: Historical Med   Order #: [id number] Class: Historical Med   Order #: [id number] Class: Historical Med   Order #: [id number] Class: Historical Med   Order #: [id number] Class: Historical Med   Order #: [id number] Class: Historical Med   Order #: [id number] Class: Historical Med    Past Surgical History:  Procedure Laterality Date   ABDOMINAL HYSTERECTOMY     APPENDECTOMY     BREAST BIOPSY Right 20+ yrs ago   EXCISIONAL - NEG   BREAST BIOPSY Right 20+ yrs ago   EXCISIONAL - NEG   CARDIAC CATHETERIZATION     2 stents in place   CHOLECYSTECTOMY      Physical Exam   Triage Vital Signs: ED Triage Vitals  Encounter Vitals Group     BP 10/15/24 2210 (!) 181/74     Girls Systolic BP Percentile --      Girls Diastolic BP  Percentile --      Boys Systolic BP Percentile --      Boys Diastolic BP Percentile --      Pulse Rate 10/15/24 2210 79     Resp 10/15/24 2210 18     Temp 10/15/24 2210 97.9 F (36.6 C)     Temp src --      SpO2 10/15/24 2210 100 %     Weight --      Height --      Head Circumference --      Peak Flow --      Pain Score 10/15/24 2209 9     Pain Loc --      Pain Education --      Exclude from Growth Chart --     Most recent vital signs: Vitals:   10/15/24 2210 10/16/24 0325  BP: (!) 181/74 (!) 163/63  Pulse: 79 77  Resp: 18 17  Temp: 97.9 F (36.6 C) (!) 97.5 F (36.4 C)  SpO2: 100% 97%    General: Awake, no distress.  CV:  Good peripheral perfusion.  Regular rate rhythm Resp:  Normal effort.  Clear lungs Abd:  No distention.  Soft nontender Other:  There is tenderness anteriorly at the right hip just inferior to the inguinal ligament as well as laterally near  the greater trochanter.  There is pain with passive range of motion.  No soft tissue inflammatory changes.   ED Results / Procedures / Treatments   Labs (all labs ordered are listed, but only abnormal results are displayed) Labs Reviewed  CBC WITH DIFFERENTIAL/PLATELET - Abnormal; Notable for the following components:      Result Value   RBC 3.26 (*)    Hemoglobin 8.7 (*)    HCT 27.5 (*)    All other components within normal limits  COMPREHENSIVE METABOLIC PANEL WITH GFR - Abnormal; Notable for the following components:   Sodium 127 (*)    Chloride 93 (*)    Glucose, Bld 215 (*)    Calcium  8.7 (*)    Alkaline Phosphatase 165 (*)    All other components within normal limits  URINALYSIS, ROUTINE W REFLEX MICROSCOPIC - Abnormal; Notable for the following components:   Color, Urine YELLOW (*)    APPearance TURBID (*)    Hgb urine dipstick SMALL (*)    Protein, ur 100 (*)    Leukocytes,Ua LARGE (*)    Bacteria, UA MANY (*)    All other components within normal limits  URINE CULTURE  PROCALCITONIN      EKG    RADIOLOGY X-ray right hip interpreted by me, negative for fracture.  Radiology report reviewed CT right hip negative for acute findings, shows extensive arthritis and connective tissue disease   PROCEDURES:  Procedures   MEDICATIONS ORDERED IN ED: Medications  acetaminophen  (TYLENOL ) tablet 1,000 mg (1,000 mg Oral Given 10/16/24 0150)  sodium chloride  0.9 % bolus 500 mL (0 mLs Intravenous Stopped 10/16/24 0458)  cefTRIAXone  (ROCEPHIN ) 2 g in sodium chloride  0.9 % 100 mL IVPB (0 g Intravenous Stopped 10/16/24 0351)     IMPRESSION / MDM / ASSESSMENT AND PLAN / ED COURSE  I reviewed the triage vital signs and the nursing notes.  DDx: Right hip fracture, bursitis, electrolyte derangement, anemia, UTI, less likely septic arthritis  Patient's presentation is most consistent with acute presentation with potential threat to life or bodily function.  Patient presents with generalized weakness and urinary frequency, found to have hyponatremia with a sodium of 127 compared to recent baseline 2 days ago at 134.  Also has a UTI.  No renal insufficiency.  Rocephin  started.  Right hip pain appears to be benign.  X-ray negative, CT hip unremarkable.  She has pain with passive range of motion, but no fever or leukocytosis or inflammatory joint CT findings to suggest septic arthritis.   She has acute functional decline and not able to adequately maintain ADLs at home with available support due to this acute illness. Will need to hospitalize for now.  ----------------------------------------- 5:19 AM on 10/16/2024 ----------------------------------------- Case d/w hospitalist       FINAL CLINICAL IMPRESSION(S) / ED DIAGNOSES   Final diagnoses:  Cystitis  Hyponatremia  General weakness  Osteoarthritis of right hip, unspecified osteoarthritis type     Rx / DC Orders   ED Discharge Orders     None        Note:  This document was prepared using Dragon voice  recognition software and may include unintentional dictation errors.   Viviann Pastor, MD 10/16/24 [phone]  "

## 2024-10-17 ENCOUNTER — Other Ambulatory Visit: Payer: Self-pay

## 2024-10-17 DIAGNOSIS — R52 Pain, unspecified: Secondary | ICD-10-CM | POA: Diagnosis not present

## 2024-10-17 LAB — COMPREHENSIVE METABOLIC PANEL WITH GFR
ALT: 15 U/L (ref 0–44)
AST: 26 U/L (ref 15–41)
Albumin: 3.8 g/dL (ref 3.5–5.0)
Alkaline Phosphatase: 165 U/L — ABNORMAL HIGH (ref 38–126)
Anion gap: 11 (ref 5–15)
BUN: 13 mg/dL (ref 8–23)
CO2: 25 mmol/L (ref 22–32)
Calcium: 8.9 mg/dL (ref 8.9–10.3)
Chloride: 93 mmol/L — ABNORMAL LOW (ref 98–111)
Creatinine, Ser: 0.87 mg/dL (ref 0.44–1.00)
GFR, Estimated: >60 mL/min
Glucose, Bld: 181 mg/dL — ABNORMAL HIGH (ref 70–99)
Potassium: 4.1 mmol/L (ref 3.5–5.1)
Sodium: 129 mmol/L — ABNORMAL LOW (ref 135–145)
Total Bilirubin: <0.2 mg/dL (ref 0.0–1.2)
Total Protein: 7 g/dL (ref 6.5–8.1)

## 2024-10-17 LAB — GLUCOSE, CAPILLARY
Glucose-Capillary: 150 mg/dL — ABNORMAL HIGH (ref 70–99)
Glucose-Capillary: 279 mg/dL — ABNORMAL HIGH (ref 70–99)

## 2024-10-17 LAB — CBC
HCT: 26.6 % — ABNORMAL LOW (ref 36.0–46.0)
Hemoglobin: 8.6 g/dL — ABNORMAL LOW (ref 12.0–15.0)
MCH: 27 pg (ref 26.0–34.0)
MCHC: 32.3 g/dL (ref 30.0–36.0)
MCV: 83.4 fL (ref 80.0–100.0)
Platelets: 219 10*3/uL (ref 150–400)
RBC: 3.19 MIL/uL — ABNORMAL LOW (ref 3.87–5.11)
RDW: 14.7 % (ref 11.5–15.5)
WBC: 7.6 10*3/uL (ref 4.0–10.5)
nRBC: 0 % (ref 0.0–0.2)

## 2024-10-17 LAB — URINE CULTURE: Culture: 70000 — AB

## 2024-10-17 LAB — PHENYTOIN LEVEL, FREE AND TOTAL
Phenytoin, Free: 0.5 ug/mL — ABNORMAL LOW (ref 1.0–2.0)
Phenytoin, Total: 5.5 ug/mL — ABNORMAL LOW (ref 10.0–20.0)

## 2024-10-17 MED ORDER — CEPHALEXIN 500 MG PO CAPS
500.0000 mg | ORAL_CAPSULE | Freq: Two times a day (BID) | ORAL | 0 refills | Status: AC
  Filled 2024-10-17: qty 10, 5d supply, fill #0

## 2024-10-17 NOTE — Care Management Obs Status (Signed)
 MEDICARE OBSERVATION STATUS NOTIFICATION   Patient Details  Name: Connie Ortega MRN: [id number] Date of Birth: [birth year]   Medicare Observation Status Notification Given:  Yes    [name] 10/17/2024, 11:34 AM

## 2024-10-17 NOTE — Plan of Care (Signed)

## 2024-10-17 NOTE — Progress Notes (Signed)
 Physical Therapy Treatment Patient Details Name: Connie Ortega MRN: [id number] DOB: 03/11/1941 Today's Date: 10/17/2024   History of Present Illness Connie Ortega is a pleasant 84 y.o. female with medical history significant for diabetes, HTN, CAD s/p PCI and stent to proximal LAD and mid RCA at [facility] 11/05/2003, HLD, GERD mildly dilated ascending aorta at 3.3 cm who was brought into ED complaining of generalized weakness, numbness and right hip pain began earlier today at home. All imaging negative for fracture. Reveals severe arthritis.    PT Comments  Patient received in bed she is agreeable to PT. Patient reports she has been walking to bathroom with assistance. She is mod I for bed mobility and transfers with cga. Patient is able to ambulate ~ 15  feet with RW and cga. She is limited by fatigue, mild nausea. She was left in recliner with needs met and chair alarm set. Patient will continue to benefit from skilled PT to improve strength, endurance and safety with mobility.     If plan is discharge home, recommend the following: A little help with walking and/or transfers;A little help with bathing/dressing/bathroom;Assist for transportation;Help with stairs or ramp for entrance   Can travel by private vehicle      Yes with A  Equipment Recommendations  None recommended by PT    Recommendations for Other Services       Precautions / Restrictions Precautions Precautions: Fall Recall of Precautions/Restrictions: Intact Restrictions Weight Bearing Restrictions Per Provider Order: No     Mobility  Bed Mobility Overal bed mobility: Modified Independent Bed Mobility: Supine to Sit     Supine to sit: Modified independent (Device/Increase time)          Transfers Overall transfer level: Needs assistance Equipment used: Rolling walker (2 wheels) Transfers: Sit to/from Stand Sit to Stand: Contact guard assist                Ambulation/Gait Ambulation/Gait  assistance: Contact guard assist Gait Distance (Feet): 15 Feet Assistive device: Rolling walker (2 wheels) Gait Pattern/deviations: Step-through pattern, Decreased step length - right, Decreased step length - left, Decreased stride length, Trunk flexed Gait velocity: decr     General Gait Details: cues needed for safe proximity to [employer] Mobility     Tilt Bed    Modified Rankin (Stroke Patients Only)       Balance Overall balance assessment: Needs assistance Sitting-balance support: Feet supported Sitting balance-Leahy Scale: Good     Standing balance support: Bilateral upper extremity supported, During functional activity, Reliant on assistive device for balance Standing balance-Leahy Scale: Good                              Communication Communication Communication: No apparent difficulties  Cognition Arousal: Alert Behavior During Therapy: WFL for tasks assessed/performed   PT - Cognitive impairments: No apparent impairments                         Following commands: Intact      Cueing Cueing Techniques: Verbal cues  Exercises      General Comments        Pertinent Vitals/Pain Pain Assessment Pain Assessment: Faces Faces Pain Scale: Hurts a little bit Pain Location: R hip Pain Descriptors / Indicators: Discomfort Pain Intervention(s): Monitored during session, Repositioned  Home Living Family/patient expects to be discharged to:: Private residence Living Arrangements: Children                      Prior Function            PT Goals (current goals can now be found in the care plan section) Acute Rehab PT Goals Patient Stated Goal: return home, improve pain PT Goal Formulation: With patient Time For Goal Achievement: 10/30/24 Progress towards PT goals: Progressing toward goals    Frequency    Min 2X/week      PT Plan      Co-evaluation              AM-PAC  PT 6 Clicks Mobility   Outcome Measure  Help needed turning from your back to your side while in a flat bed without using bedrails?: A Little Help needed moving from lying on your back to sitting on the side of a flat bed without using bedrails?: A Little Help needed moving to and from a bed to a chair (including a wheelchair)?: A Little Help needed standing up from a chair using your arms (e.g., wheelchair or bedside chair)?: A Little Help needed to walk in hospital room?: A Little Help needed climbing 3-5 steps with a railing? : A Lot 6 Click Score: 17    End of Session   Activity Tolerance: Patient limited by fatigue;Patient limited by pain Patient left: in chair;with call bell/phone within reach;with chair alarm set Nurse Communication: Mobility status PT Visit Diagnosis: Other abnormalities of gait and mobility (R26.89);Muscle weakness (generalized) (M62.81);Difficulty in walking, not elsewhere classified (R26.2) Pain - Right/Left: Right Pain - part of body: Hip     Time: 1120-1130 PT Time Calculation (min) (ACUTE ONLY): 10 min  Charges:    $Gait Training: 8-22 mins PT General Charges $$ ACUTE PT VISIT: 1 Visit                     Aditri Louischarles, PT, GCS 10/17/24,12:11 PM

## 2024-10-17 NOTE — Care Management Obs Status (Signed)
 MEDICARE OBSERVATION STATUS NOTIFICATION   Patient Details  Name: Connie Ortega MRN: [id number] Date of Birth: [birth year]   Medicare Observation Status Notification Given:       [name] 10/17/2024, 11:33 AM
